# Patient Record
Sex: Male | Born: 1955 | Race: White | Hispanic: No | Marital: Married | Smoking: Former smoker
Health system: Southern US, Community
[De-identification: ages and names within clinical notes are randomized; demographics above are authoritative.]

## PROBLEM LIST (undated history)

## (undated) DIAGNOSIS — I10 Essential (primary) hypertension: Secondary | ICD-10-CM

## (undated) DIAGNOSIS — R011 Cardiac murmur, unspecified: Secondary | ICD-10-CM

## (undated) DIAGNOSIS — J309 Allergic rhinitis, unspecified: Secondary | ICD-10-CM

## (undated) DIAGNOSIS — M1990 Unspecified osteoarthritis, unspecified site: Secondary | ICD-10-CM

## (undated) DIAGNOSIS — M797 Fibromyalgia: Secondary | ICD-10-CM

## (undated) DIAGNOSIS — L409 Psoriasis, unspecified: Secondary | ICD-10-CM

## (undated) DIAGNOSIS — J302 Other seasonal allergic rhinitis: Secondary | ICD-10-CM

## (undated) DIAGNOSIS — E782 Mixed hyperlipidemia: Secondary | ICD-10-CM

## (undated) DIAGNOSIS — D649 Anemia, unspecified: Secondary | ICD-10-CM

## (undated) DIAGNOSIS — R351 Nocturia: Principal | ICD-10-CM

## (undated) DIAGNOSIS — F5101 Primary insomnia: Secondary | ICD-10-CM

## (undated) HISTORY — DX: Fibromyalgia: M79.7

## (undated) HISTORY — DX: Cardiac murmur, unspecified: R01.1

## (undated) HISTORY — PX: BICEPS TENDON REPAIR: SHX566

## (undated) HISTORY — DX: Essential (primary) hypertension: I10

## (undated) HISTORY — DX: Unspecified osteoarthritis, unspecified site: M19.90

## (undated) HISTORY — DX: Psoriasis, unspecified: L40.9

## (undated) HISTORY — DX: Allergic rhinitis, unspecified: J30.9

---

## 2009-03-04 ENCOUNTER — Ambulatory Visit: Payer: Self-pay | Admitting: Diagnostic Radiology

## 2009-03-04 ENCOUNTER — Ambulatory Visit (HOSPITAL_BASED_OUTPATIENT_CLINIC_OR_DEPARTMENT_OTHER): Admission: RE | Admit: 2009-03-04 | Discharge: 2009-03-04 | Payer: Self-pay | Admitting: Family Medicine

## 2012-02-29 ENCOUNTER — Encounter (INDEPENDENT_AMBULATORY_CARE_PROVIDER_SITE_OTHER): Payer: Self-pay | Admitting: Surgery

## 2012-03-02 ENCOUNTER — Other Ambulatory Visit (INDEPENDENT_AMBULATORY_CARE_PROVIDER_SITE_OTHER): Payer: Self-pay | Admitting: Surgery

## 2012-03-02 ENCOUNTER — Telehealth (INDEPENDENT_AMBULATORY_CARE_PROVIDER_SITE_OTHER): Payer: Self-pay | Admitting: General Surgery

## 2012-03-02 ENCOUNTER — Encounter (INDEPENDENT_AMBULATORY_CARE_PROVIDER_SITE_OTHER): Payer: Self-pay | Admitting: Surgery

## 2012-03-02 ENCOUNTER — Ambulatory Visit
Admission: RE | Admit: 2012-03-02 | Discharge: 2012-03-02 | Disposition: A | Discharge status: Home / Self Care | Payer: Self-pay | Source: Ambulatory Visit | Attending: Surgery | Admitting: Surgery

## 2012-03-02 ENCOUNTER — Ambulatory Visit (INDEPENDENT_AMBULATORY_CARE_PROVIDER_SITE_OTHER): Payer: BC Managed Care – PPO | Admitting: Surgery

## 2012-03-02 VITALS — BP 154/98 | HR 46 | Temp 97.8°F | Ht 67.5 in | Wt 192.8 lb

## 2012-03-02 DIAGNOSIS — N433 Hydrocele, unspecified: Secondary | ICD-10-CM

## 2012-03-02 DIAGNOSIS — N5089 Other specified disorders of the male genital organs: Secondary | ICD-10-CM | POA: Insufficient documentation

## 2012-03-02 NOTE — Progress Notes (Signed)
Patient ID: Walter Franklin, male   DOB: 10/15/1956, 56 y.o.   MRN: [id number]  Chief Complaint  Patient presents with  . Pre-op Exam    eval RIH    HPI Walter Franklin is a 56 y.o. male.  Referred  By Dr. Aleatha Borer for evaluation of possible right inguinal hernia HPI This is a 56 year old male who presents with a one-month history of swelling in his right scrotum. The patient denies any significant pain in his groin but does fill a lot of heaviness in his scrotum. He denies any obstructive symptoms. He does not remember any exacerbating event. He was evaluated by Dr. Kathrynn Running who felt that he might have a right inguinal hernia and the patient is now referred for surgical evaluation. The patient has a developmentally disabled child at home and has to do a lot of heavy lifting. Past Medical History  Diagnosis Date  . Hypertension   . Allergic rhinitis, cause unspecified   . Fibromyalgia   . Psoriasis   . Arthritis   . Asthma   . Heart murmur     History reviewed. No pertinent past surgical history.  Family History  Problem Relation Age of Onset  . Hypertension Mother     ? pt's not sure    Social History History  Substance Use Topics  . Smoking status: Former Games developer  . Smokeless tobacco: Not on file  . Alcohol Use: No    Allergies  Allergen Reactions  . Tetanus Toxoids     Current Outpatient Prescriptions  Medication Sig Dispense Refill  . ADALIMUMAB McEwen Inject into the skin.      . fluticasone (FLONASE) 50 MCG/ACT nasal spray Place 2 sprays into the nose daily.      . hydrochlorothiazide (HYDRODIURIL) 25 MG tablet Take 25 mg by mouth daily.      Marland Kitchen ibuprofen (ADVIL,MOTRIN) 200 MG tablet Take 200 mg by mouth every 6 (six) hours as needed.      Marland Kitchen losartan (COZAAR) 100 MG tablet Take 100 mg by mouth daily.        Review of Systems Review of Systems  Constitutional: Negative for fever, chills and unexpected weight change.  HENT: Negative for hearing loss,  congestion, sore throat, trouble swallowing and voice change.   Eyes: Negative for visual disturbance.  Respiratory: Negative for cough and wheezing.   Cardiovascular: Negative for chest pain, palpitations and leg swelling.  Gastrointestinal: Negative for nausea, vomiting, abdominal pain, diarrhea, constipation, blood in stool, abdominal distention, anal bleeding and rectal pain.  Genitourinary: Positive for scrotal swelling and testicular pain. Negative for hematuria and difficulty urinating.  Musculoskeletal: Positive for arthralgias.  Skin: Negative for rash and wound.  Neurological: Negative for seizures, syncope, weakness and headaches.  Hematological: Negative for adenopathy. Does not bruise/bleed easily.  Psychiatric/Behavioral: Negative for confusion.    Blood pressure 154/98, pulse 46, temperature 97.8 F (36.6 C), temperature source Temporal, height 5' 7.5" (1.715 m), weight 192 lb 12.8 oz (87.454 kg), SpO2 98.00%.  Physical Exam Physical Exam WDWN in NAD HEENT:  EOMI, sclera anicteric Neck:  No masses, no thyromegaly Lungs:  CTA bilaterally; normal respiratory effort CV:  Regular rate and rhythm; no murmurs Abd:  +bowel sounds, soft, non-tender, no masses GU;  Large right scrotal swelling with no inflammation; some penile retraction; This scrotal mass extends up to the groin, but does not reduce.  This mass does not really enlarge with Valsalva maneuver. Ext:  Well-perfused; no edema Skin:  Warm,  dry; no sign of jaundice  Data Reviewed none  Assessment    Right scrotal swelling - possible right inguinal hernia vs. hydrocele    Plan    Right scrotal ultrasound to rule out hydrocele.  If this is a hydrocele, then we will refer to urology.  If not, then we will plan a right inguinal hernia repair with mesh.  Recheck in 1 week.       Ravin Bendall K. 03/02/2012, 12:23 PM

## 2012-03-02 NOTE — Telephone Encounter (Signed)
Called pt and got him set to see Dr Brunilda Payor on 03-07-12 and I cancelled his appt on the 03-07-12 with [place]

## 2012-03-07 ENCOUNTER — Encounter (INDEPENDENT_AMBULATORY_CARE_PROVIDER_SITE_OTHER): Payer: BC Managed Care – PPO | Admitting: Surgery

## 2013-02-21 ENCOUNTER — Encounter (INDEPENDENT_AMBULATORY_CARE_PROVIDER_SITE_OTHER): Payer: Self-pay | Admitting: Surgery

## 2013-02-22 ENCOUNTER — Other Ambulatory Visit: Payer: Self-pay | Admitting: Urology

## 2013-04-03 ENCOUNTER — Ambulatory Visit (HOSPITAL_BASED_OUTPATIENT_CLINIC_OR_DEPARTMENT_OTHER): Admission: RE | Admit: 2013-04-03 | Payer: BC Managed Care – PPO | Source: Ambulatory Visit | Admitting: Urology

## 2013-04-03 ENCOUNTER — Encounter (HOSPITAL_BASED_OUTPATIENT_CLINIC_OR_DEPARTMENT_OTHER): Admission: RE | Payer: Self-pay | Source: Ambulatory Visit

## 2013-04-03 SURGERY — HYDROCELECTOMY
Anesthesia: General | Laterality: Right

## 2017-01-22 ENCOUNTER — Encounter (HOSPITAL_BASED_OUTPATIENT_CLINIC_OR_DEPARTMENT_OTHER): Payer: Self-pay | Admitting: Emergency Medicine

## 2017-01-22 ENCOUNTER — Emergency Department (HOSPITAL_BASED_OUTPATIENT_CLINIC_OR_DEPARTMENT_OTHER): Payer: Managed Care, Other (non HMO)

## 2017-01-22 ENCOUNTER — Emergency Department (HOSPITAL_BASED_OUTPATIENT_CLINIC_OR_DEPARTMENT_OTHER)
Admission: EM | Admit: 2017-01-22 | Discharge: 2017-01-22 | Disposition: A | Discharge status: Home / Self Care | Payer: Managed Care, Other (non HMO) | Attending: Emergency Medicine | Admitting: Emergency Medicine

## 2017-01-22 DIAGNOSIS — I10 Essential (primary) hypertension: Secondary | ICD-10-CM | POA: Diagnosis not present

## 2017-01-22 DIAGNOSIS — J45909 Unspecified asthma, uncomplicated: Secondary | ICD-10-CM | POA: Insufficient documentation

## 2017-01-22 DIAGNOSIS — R1011 Right upper quadrant pain: Secondary | ICD-10-CM

## 2017-01-22 DIAGNOSIS — K5900 Constipation, unspecified: Secondary | ICD-10-CM | POA: Insufficient documentation

## 2017-01-22 DIAGNOSIS — Z87891 Personal history of nicotine dependence: Secondary | ICD-10-CM | POA: Insufficient documentation

## 2017-01-22 DIAGNOSIS — Z79899 Other long term (current) drug therapy: Secondary | ICD-10-CM | POA: Insufficient documentation

## 2017-01-22 LAB — COMPREHENSIVE METABOLIC PANEL
ALT: 28 U/L (ref 17–63)
ANION GAP: 7 (ref 5–15)
AST: 21 U/L (ref 15–41)
Albumin: 4.1 g/dL (ref 3.5–5.0)
Alkaline Phosphatase: 56 U/L (ref 38–126)
BUN: 12 mg/dL (ref 6–20)
CALCIUM: 9.3 mg/dL (ref 8.9–10.3)
CHLORIDE: 106 mmol/L (ref 101–111)
CO2: 27 mmol/L (ref 22–32)
CREATININE: 0.89 mg/dL (ref 0.61–1.24)
Glucose, Bld: 94 mg/dL (ref 65–99)
Potassium: 4.1 mmol/L (ref 3.5–5.1)
SODIUM: 140 mmol/L (ref 135–145)
Total Bilirubin: 0.7 mg/dL (ref 0.3–1.2)
Total Protein: 7.6 g/dL (ref 6.5–8.1)

## 2017-01-22 LAB — CBC WITH DIFFERENTIAL/PLATELET
BASOS ABS: 0.1 10*3/uL (ref 0.0–0.1)
Basophils Relative: 1 %
EOS ABS: 0.2 10*3/uL (ref 0.0–0.7)
Eosinophils Relative: 2 %
HCT: 38.7 % — ABNORMAL LOW (ref 39.0–52.0)
HEMOGLOBIN: 12.7 g/dL — AB (ref 13.0–17.0)
LYMPHS PCT: 18 %
Lymphs Abs: 1.7 10*3/uL (ref 0.7–4.0)
MCH: 21.2 pg — ABNORMAL LOW (ref 26.0–34.0)
MCHC: 32.8 g/dL (ref 30.0–36.0)
MCV: 64.7 fL — ABNORMAL LOW (ref 78.0–100.0)
Monocytes Absolute: 0.9 10*3/uL (ref 0.1–1.0)
Monocytes Relative: 10 %
NEUTROS PCT: 69 %
Neutro Abs: 6.3 10*3/uL (ref 1.7–7.7)
PLATELETS: 227 10*3/uL (ref 150–400)
RBC: 5.98 MIL/uL — AB (ref 4.22–5.81)
RDW: 17.6 % — ABNORMAL HIGH (ref 11.5–15.5)
WBC: 9.2 10*3/uL (ref 4.0–10.5)

## 2017-01-22 LAB — D-DIMER, QUANTITATIVE (NOT AT ARMC): D DIMER QUANT: 0.29 ug{FEU}/mL (ref 0.00–0.50)

## 2017-01-22 LAB — LIPASE, BLOOD: LIPASE: 29 U/L (ref 11–51)

## 2017-01-22 MED ORDER — LIDOCAINE HCL 2 % IJ SOLN: 10.0000 mL | Freq: Once | INTRAMUSCULAR | Status: DC

## 2017-01-22 MED ORDER — MAGNESIUM CITRATE PO SOLN
1.0000 | Freq: Once | ORAL | Status: AC
  Administered 2017-01-22: 1 via ORAL
  Filled 2017-01-22: qty 296

## 2017-01-22 NOTE — Discharge Instructions (Signed)
Take magnesium citrate to help you have a bowel movement. Tylenol for pain as needed. If no improvement with having a normal bowel movement, return to ED or follow up with your doctor for further imaging. Return if worsening symptoms.

## 2017-01-22 NOTE — ED Provider Notes (Signed)
[facility] Note   CSN: [id number] Arrival date & time: 01/22/17  1242     History   Chief Complaint Chief Complaint  Patient presents with  . Abdominal Pain    HPI Walter Franklin is a 61 y.o. male.  HPI Walter Franklin is a 61 y.o. male with history of fibromyalgia, arthritis, hypertension, presents to emergency department complaining of abdominal pain. Patient states that he is having pain in the right upper quadrant for several days. Denies associated nausea or vomiting. He also reports feeling constipated. Is unable to have abdominal bowel movement, states "just have small pebbles come out." He has taken MiraLAX yesterday and a dose of MiraLAX today with no improvement. Denies any fever or chills. He does have pain that is worse after eating and also with deep breathing. Patient does travel by car a lot for his job. Travels back and forth from [place]. No medications other than MiraLAX taken prior to coming in.  Past Medical History:  Diagnosis Date  . Allergic rhinitis, cause unspecified   . Arthritis   . Asthma   . Fibromyalgia   . Heart murmur   . Hypertension   . Psoriasis     Patient Active Problem List   Diagnosis Date Noted  . Scrotal swelling 03/02/2012    Past Surgical History:  Procedure Laterality Date  . BICEPS TENDON REPAIR         Home Medications    Prior to Admission medications   Medication Sig Start Date End Date Taking? Authorizing Provider  ADALIMUMAB Secor Inject into the skin.   Yes Historical Provider, MD  albuterol (PROVENTIL HFA;VENTOLIN HFA) 108 (90 Base) MCG/ACT inhaler Inhale 1-2 puffs into the lungs every 6 (six) hours as needed for wheezing or shortness of breath.   Yes Historical Provider, MD  amLODipine (NORVASC) 10 MG tablet Take 10 mg by mouth daily.   Yes Historical Provider, MD  Azilsartan Medoxomil (EDARBI) 80 MG TABS Take by mouth.   Yes Historical Provider, MD  fluticasone (FLONASE) 50  MCG/ACT nasal spray Place 2 sprays into the nose daily.   Yes Historical Provider, MD  ibuprofen (ADVIL,MOTRIN) 200 MG tablet Take 200 mg by mouth every 6 (six) hours as needed.   Yes Historical Provider, MD  losartan (COZAAR) 100 MG tablet Take 100 mg by mouth daily.   Yes Historical Provider, MD  rosuvastatin (CRESTOR) 5 MG tablet Take 5 mg by mouth daily.   Yes Historical Provider, MD  hydrochlorothiazide (HYDRODIURIL) 25 MG tablet Take 25 mg by mouth daily.    Historical Provider, MD    Family History Family History  Problem Relation Age of Onset  . Hypertension Mother     ? pt's not sure    Social History Social History  Substance Use Topics  . Smoking status: Former Games developermoker  . Smokeless tobacco: Never Used  . Alcohol use No     Allergies   Tetanus toxoids   Review of Systems Review of Systems  Constitutional: Negative for chills and fever.  Respiratory: Negative for cough, chest tightness and shortness of breath.   Cardiovascular: Negative for chest pain, palpitations and leg swelling.  Gastrointestinal: Positive for abdominal pain and constipation. Negative for abdominal distention, diarrhea, nausea and vomiting.  Genitourinary: Negative for dysuria, frequency, hematuria and urgency.  Musculoskeletal: Negative for arthralgias, myalgias, neck pain and neck stiffness.  Skin: Negative for rash.  Allergic/Immunologic: Negative for immunocompromised state.  Neurological: Negative for dizziness, weakness, light-headedness,  numbness and headaches.     Physical Exam Updated Vital Signs BP (!) 159/100 (BP Location: Left Arm)   Pulse 65   Temp 98.9 F (37.2 C) (Oral)   Ht 5\' 7"  (1.702 m)   Wt 83.9 kg   SpO2 100%   BMI 28.98 kg/m   Physical Exam  Constitutional: He appears well-developed and well-nourished. No distress.  HENT:  Head: Normocephalic and atraumatic.  Eyes: Conjunctivae are normal.  Neck: Neck supple.  Cardiovascular: Normal rate, regular rhythm and  normal heart sounds.   Pulmonary/Chest: Effort normal. No respiratory distress. He has no wheezes. He has no rales.  Abdominal: Soft. Bowel sounds are normal. He exhibits no distension. There is tenderness. There is no rebound.  Right upper quadrant tenderness  Musculoskeletal: He exhibits no edema.  Neurological: He is alert.  Skin: Skin is warm and dry.  Nursing note and vitals reviewed.    ED Treatments / Results  Labs (all labs ordered are listed, but only abnormal results are displayed) Labs Reviewed  CBC WITH DIFFERENTIAL/PLATELET - Abnormal; Notable for the following:       Result Value   RBC 5.98 (*)    Hemoglobin 12.7 (*)    HCT 38.7 (*)    MCV 64.7 (*)    MCH 21.2 (*)    RDW 17.6 (*)    All other components within normal limits  COMPREHENSIVE METABOLIC PANEL  LIPASE, BLOOD  D-DIMER, QUANTITATIVE (NOT AT [facility])    EKG  EKG Interpretation None       Radiology US Abdomen Complete  Result Date: 01/22/2017 CLINICAL DATA:  Patient with stabbing right upper quadrant abdominal pain. EXAM: ABDOMEN ULTRASOUND COMPLETE COMPARISON:  Testicular ultrasound 03/02/2012 FINDINGS: Gallbladder: No gallstones or wall thickening visualized. No sonographic Murphy sign noted by sonographer. Common bile duct: Diameter: 2.8 mm Liver: Normal echogenicity. Subcentimeter hypoechoic lesion may represent a small cyst however is too small to characterize. IVC: No abnormality visualized. Pancreas: Not visualized due to overlying bowel gas. Spleen: Size and appearance within normal limits. Right Kidney: Length: 10.9 cm. Echogenicity within normal limits. No mass or hydronephrosis visualized. Left Kidney: Length: 12.1 cm. Echogenicity within normal limits. No mass or hydronephrosis visualized. Abdominal aorta: No aneurysm visualized. Other findings: None. IMPRESSION: No acute process within the abdomen. Electronically Signed   By: Annia Belt M.D.   On: 01/22/2017 15:44    Procedures Procedures  (including critical care time)  Medications Ordered in ED Medications  magnesium citrate solution 1 Bottle (not administered)     Initial Impression / Assessment and Plan / ED Course  I have reviewed the triage vital signs and the nursing notes.  Pertinent labs & imaging results that were available during my care of the patient were reviewed by me and considered in my medical decision making (see chart for details).     Pt in ED with RUQ abdominal pain and right pleuritic pain. Risk factor for PE given prolonged travel. Will check labs including LFTs and D dimer.   Labs negative including d dimer. WIll get [place] to ro cholelithiasis/cholecystitis.   4:15 PM [place] negative. Patient has not had any nausea or vomiting, no fevers, normal WBC, considered colitis, however given no elevation with blood cell count and no diarrhea most unlikely. Patient is having some constipation and has not had a normal bowel movement in several days. I will treat him with magnesium citrate. Advised to continue MiraLAX. If pain is not improved with normal bowel movement, advised  to follow-up with family doctor or return if worsening symptoms. Patient voiced understanding.  Vitals:   01/22/17 1248 01/22/17 1612  BP: (!) 159/100 (!) 147/96  Pulse: 65 82  Resp:  20  Temp: 98.9 F (37.2 C)   TempSrc: Oral   SpO2: 100% 100%  Weight: 83.9 kg   Height: 5\' 7"  (1.702 m)      Final Clinical Impressions(s) / ED Diagnoses   Final diagnoses:  Right upper quadrant abdominal pain    New Prescriptions New Prescriptions   No medications on file     Jaynie Crumble, PA-C 01/22/17 1616    Melene Plan, DO 01/23/17 [phone]

## 2017-01-22 NOTE — ED Notes (Signed)
Pt discharged to home with family. NAD.  

## 2017-01-22 NOTE — ED Triage Notes (Signed)
Pt states stabbing RUQ pain for past few days and constipation for past 1 week.  Pt states he has had very small hard bowel movements in past few days and normally he is very regular.

## 2019-06-08 LAB — COVID-19: CORONAVIRUS (COVID-19), NAA: NOT DETECTED

## 2019-06-12 LAB — COMPREHENSIVE METABOLIC PANEL
ALT: 31 U/L (ref 0–41)
AST: 22 U/L (ref 0–40)
Albumin/Globulin Ratio: 1.5 mmol/L (ref 1.00–2.00)
Albumin: 4.3 g/dL (ref 3.5–5.2)
Alk Phosphatase: 58 U/L (ref 40–130)
Anion Gap: 12 mmol/L (ref 2–17)
BUN: 35 mg/dL — ABNORMAL HIGH (ref 8–23)
CO2: 26 mmol/L (ref 22–29)
Calcium: 9.5 mg/dL (ref 8.8–10.2)
Chloride: 105 mmol/L (ref 98–107)
Creatinine: 1.3 mg/dL (ref 0.7–1.3)
GFR African American: 67 mL/min/{1.73_m2} — ABNORMAL LOW (ref >=90)
GFR Non-African American: 58 mL/min/{1.73_m2} — ABNORMAL LOW (ref >=90)
Globulin: 3 g/dL (ref 1.9–4.4)
Glucose: 105 mg/dL — ABNORMAL HIGH (ref 70–99)
Osmolaliy Calculated: 293 mosm/kg — ABNORMAL HIGH (ref 270–287)
Potassium: 4.1 mmol/L (ref 3.5–5.3)
Sodium: 143 mmol/L (ref 135–145)
Total Bilirubin: 0.5 mg/dL (ref 0.00–1.20)
Total Protein: 7.1 g/dL (ref 6.4–8.3)

## 2019-06-12 LAB — MAGNESIUM: Magnesium: 2.6 mg/dL (ref 1.6–2.6)

## 2019-06-12 LAB — LIPID PANEL
Chol/HDL Ratio: 3.6 (ref 0.0–4.4)
Cholesterol: 143 mg/dL (ref 100–200)
HDL: 40 mg/dL — ABNORMAL LOW (ref 55–72)
LDL Cholesterol: 74 mg/dL (ref 0.0–100.0)
LDL/HDL Ratio: 1.9
Triglycerides: 145 mg/dL (ref 0–149)
VLDL: 29 mg/dL (ref 5.0–40.0)

## 2019-06-12 LAB — PSA SCREENING: PSA, Screening: 0.575 ng/mL (ref 0.000–4.000)

## 2019-06-13 LAB — CBC WITH AUTO DIFFERENTIAL
Basophils %: 1.4 % (ref 0.0–2.0)
Basophils Absolute: 0.1 10*3/uL (ref 0.0–0.2)
Eosinophils %: 6.4 % (ref 0.0–7.0)
Eosinophils Absolute: 0.4 10*3/uL (ref 0.0–0.5)
Hematocrit: 39 % (ref 38.0–52.0)
Hemoglobin: 12.1 g/dL — ABNORMAL LOW (ref 13.0–17.3)
Immature Grans (Abs): 0.01 10*3/uL (ref 0.00–0.06)
Immature Granulocytes %: 0.2 % (ref 0.1–0.6)
Lymphocytes Absolute: 2.3 10*3/uL (ref 1.0–3.2)
Lymphocytes: 38.3 % (ref 15.0–45.0)
MCH: 21.4 pg — ABNORMAL LOW (ref 27.0–34.5)
MCHC: 31 g/dL — ABNORMAL LOW (ref 32.0–36.0)
MCV: 68.9 fL — ABNORMAL LOW (ref 84.0–100.0)
MPV: 11.5 fL (ref 7.2–13.2)
Monocytes %: 12.8 % — ABNORMAL HIGH (ref 4.0–12.0)
Monocytes Absolute: 0.8 10*3/uL (ref 0.3–1.0)
NRBC Absolute: 0 10*3/uL (ref 0.000–0.012)
NRBC Automated: 0 % (ref 0.0–0.2)
Neutrophils %: 40.9 % — ABNORMAL LOW (ref 42.0–74.0)
Neutrophils Absolute: 2.4 10*3/uL (ref 1.6–7.3)
Platelets: 236 10*3/uL (ref 140–440)
RBC: 5.66 x10e6/mcL — ABNORMAL HIGH (ref 4.00–5.60)
RDW: 16.5 % — ABNORMAL HIGH (ref 11.0–16.0)
WBC: 5.9 10*3/uL (ref 3.8–10.6)

## 2019-06-13 LAB — MORPHOLOGY CHECK
Platelet Estimate: ADEQUATE
RBC Morphology: ABNORMAL — AB

## 2019-06-19 LAB — IRON AND TIBC
Iron % Saturation: 40 % (ref 20–40)
Iron: 119 ug/dL (ref 59–158)
TIBC: 298 ug/dL (ref 250–450)
UIBC: 179 ug/dL (ref 112.0–347.0)

## 2019-06-19 LAB — CBC WITH AUTO DIFFERENTIAL
Basophils %: 1.3 % (ref 0.0–2.0)
Basophils Absolute: 0.1 10*3/uL (ref 0.0–0.2)
Eosinophils %: 5.1 % (ref 0.0–7.0)
Eosinophils Absolute: 0.3 10*3/uL (ref 0.0–0.5)
Hematocrit: 39.8 % (ref 38.0–52.0)
Hemoglobin: 12.6 g/dL — ABNORMAL LOW (ref 13.0–17.3)
Immature Grans (Abs): 0.01 10*3/uL (ref 0.00–0.06)
Immature Granulocytes %: 0.2 % (ref 0.1–0.6)
Lymphocytes Absolute: 2.1 10*3/uL (ref 1.0–3.2)
Lymphocytes: 34.8 % (ref 15.0–45.0)
MCH: 21.7 pg — ABNORMAL LOW (ref 27.0–34.5)
MCHC: 31.7 g/dL — ABNORMAL LOW (ref 32.0–36.0)
MCV: 68.6 fL — ABNORMAL LOW (ref 84.0–100.0)
MPV: 11.2 fL (ref 7.2–13.2)
Monocytes %: 9.6 % (ref 4.0–12.0)
Monocytes Absolute: 0.6 10*3/uL (ref 0.3–1.0)
NRBC Absolute: 0 10*3/uL (ref 0.000–0.012)
NRBC Automated: 0 % (ref 0.0–0.2)
Neutrophils %: 49 % (ref 42.0–74.0)
Neutrophils Absolute: 3 10*3/uL (ref 1.6–7.3)
Platelets: 247 10*3/uL (ref 140–440)
RBC: 5.8 x10e6/mcL — ABNORMAL HIGH (ref 4.00–5.60)
RDW: 16 % (ref 11.0–16.0)
WBC: 6 10*3/uL (ref 3.8–10.6)

## 2019-06-19 LAB — MORPHOLOGY CHECK
Platelet Estimate: ADEQUATE
RBC Morphology: ABNORMAL — AB

## 2019-06-19 LAB — VITAMIN B12: Vitamin B-12: 701 pg/mL (ref 232–1245)

## 2019-06-19 LAB — FOLATE: Folate: >20 ng/mL (ref 4.80–24.20)

## 2019-06-19 LAB — FERRITIN: Ferritin: 173.7 ng/mL (ref 30.0–400.0)

## 2021-06-04 ENCOUNTER — Encounter

## 2021-06-22 ENCOUNTER — Encounter: Admit: 2021-06-22 | Discharge: 2021-06-22 | Payer: BLUE CROSS/BLUE SHIELD | Primary: Family Medicine

## 2021-06-22 DIAGNOSIS — I10 Essential (primary) hypertension: Secondary | ICD-10-CM

## 2021-06-22 LAB — AMB POC COMPLETE CBC, AUTOMATED
Hematocrit, POC: 36.4 %
Hemoglobin, POC: 11.6 G/DL
Platelet Count, POC: 212 10*3/uL
WBC, POC: 6.49 10*3/uL

## 2021-06-22 LAB — COMPREHENSIVE METABOLIC PANEL
ALT: 38 U/L (ref 0–50)
AST: 26 U/L (ref 0–50)
Albumin/Globulin Ratio: 1.7 (ref 1.00–2.70)
Albumin: 4.5 g/dL (ref 3.5–5.2)
Alk Phosphatase: 51 U/L (ref 40–130)
Anion Gap: 11 mmol/L (ref 2–17)
BUN: 27 mg/dL — ABNORMAL HIGH (ref 8–23)
CO2: 27 mmol/L (ref 22–29)
Calcium: 9.6 mg/dL (ref 8.8–10.2)
Chloride: 104 mmol/L (ref 98–107)
Creatinine: 1.3 mg/dL (ref 0.7–1.3)
Est, Glom Filt Rate: 61 mL/min/1.73m?? — ABNORMAL LOW (ref >=90)
Globulin: 2.6 g/dL (ref 1.9–4.4)
Glucose: 111 mg/dL — ABNORMAL HIGH (ref 70–99)
OSMOLALITY CALCULATED: 289 mOsm/kg — ABNORMAL HIGH (ref 270–287)
Potassium: 4 mmol/L (ref 3.5–5.3)
Sodium: 142 mmol/L (ref 135–145)
Total Bilirubin: 0.36 mg/dL (ref 0.00–1.20)
Total Protein: 7.1 g/dL (ref 6.4–8.3)

## 2021-06-22 LAB — PSA SCREENING: Screening PSA: 0.482 ng/mL (ref 0.000–4.000)

## 2021-06-22 LAB — LIPID PANEL
Chol/HDL Ratio: 3.1 (ref 0.0–4.4)
Cholesterol: 131 mg/dL (ref 100–200)
HDL: 42 mg/dL (ref >=40)
LDL Cholesterol: 56.6 mg/dL (ref 0.0–100.0)
LDL/HDL Ratio: 1.3
Triglycerides: 162 mg/dL — ABNORMAL HIGH (ref 0–149)
VLDL: 32.4 mg/dL (ref 5.0–40.0)

## 2021-06-22 LAB — MAGNESIUM: Magnesium: 2.1 mg/dL (ref 1.6–2.6)

## 2021-06-22 LAB — IRON AND TIBC
Iron Saturation: 31 % (ref 20–40)
Iron: 97 ug/dL (ref 59–158)
TIBC: 316 ug/dL (ref 250–450)
UIBC: 219 ug/dL (ref 112.0–347.0)

## 2021-06-22 LAB — FERRITIN: Ferritin: 107 ng/mL (ref 30.0–400.0)

## 2021-06-22 LAB — FOLATE: Folate: >20 ng/mL (ref 4.80–24.20)

## 2021-06-22 LAB — VITAMIN B12: Vitamin B-12: 536 pg/mL (ref 232–1245)

## 2021-06-22 NOTE — Patient Instructions (Signed)
05/22/2021

## 2021-07-01 NOTE — Telephone Encounter (Signed)
Pt would like a call back about lab results

## 2021-07-10 NOTE — Telephone Encounter (Signed)
Pt calling back asking for lab results. Please advise.Marland KitchenMarland Kitchen

## 2021-07-10 NOTE — Telephone Encounter (Signed)
Called pt for 2nd time - lm

## 2021-07-14 MED ORDER — FLUTICASONE PROPIONATE 50 MCG/ACT NA SUSP
50 MCG/ACT | Freq: Every day | NASAL | 3 refills | Status: DC
Start: 2021-07-14 — End: 2023-04-20

## 2021-07-14 NOTE — Telephone Encounter (Signed)
From: Larna Daughters  To: Office of Dr. Tera Partridge  Sent: 07/13/2021 4:11 PM EDT  Subject: Medication Renewal Request    Refills have been requested for the following medications:   Other - Not sure why it's not on the list, but please refill my Fluticasone Nasal Spray. Thank you.    Preferred pharmacy: [facility] [address] [place] - [address] - P [phone] - F [phone]

## 2021-07-14 NOTE — Telephone Encounter (Signed)
Refill sent

## 2021-08-08 ENCOUNTER — Encounter

## 2021-08-15 ENCOUNTER — Encounter

## 2021-08-17 NOTE — Telephone Encounter (Signed)
From: Larna Daughters  To: Office of Dr. Tera Partridge  Sent: 08/15/2021 10:31 AM EDT  Subject: Medication Renewal Request    Refills have been requested for the following medications:   Other - [facility] my chlorthalidone, amlodipine, and rosuvastatin as I'm running very low.    Preferred pharmacy: [facility] [address] [place] - [address] - P [phone] - F [phone]

## 2021-08-21 MED ORDER — ROSUVASTATIN CALCIUM 10 MG PO TABS
10 MG | ORAL_TABLET | ORAL | 2 refills | Status: AC
Start: 2021-08-21 — End: 2022-05-29

## 2021-08-21 MED ORDER — AMLODIPINE BESYLATE 5 MG PO TABS
5 MG | ORAL_TABLET | ORAL | 2 refills | Status: AC
Start: 2021-08-21 — End: 2022-05-29

## 2021-08-21 MED ORDER — CHLORTHALIDONE 25 MG PO TABS
25 MG | ORAL_TABLET | ORAL | 2 refills | Status: AC
Start: 2021-08-21 — End: 2022-05-29

## 2021-09-16 MED ORDER — IBUPROFEN 800 MG PO TABS
800 MG | ORAL_TABLET | Freq: Two times a day (BID) | ORAL | 1 refills | Status: AC | PRN
Start: 2021-09-16 — End: 2021-12-15

## 2021-10-12 NOTE — Telephone Encounter (Addendum)
Called patient. Left message for patient to call back to schedule OV.

## 2021-10-13 MED ORDER — EDARBI 80 MG PO TABS
80 MG | ORAL_TABLET | ORAL | 3 refills | Status: AC
Start: 2021-10-13 — End: 2022-05-31

## 2021-10-22 ENCOUNTER — Ambulatory Visit: Admit: 2021-10-22 | Discharge: 2021-10-22 | Payer: BLUE CROSS/BLUE SHIELD | Attending: Family | Primary: Family Medicine

## 2021-10-22 DIAGNOSIS — H9201 Otalgia, right ear: Secondary | ICD-10-CM

## 2021-10-22 MED ORDER — FLUTICASONE PROPIONATE 50 MCG/ACT NA SUSP
50 MCG/ACT | Freq: Two times a day (BID) | NASAL | 5 refills | Status: AC
Start: 2021-10-22 — End: ?

## 2021-10-22 MED ORDER — LORATADINE 10 MG PO TABS
10 MG | ORAL_TABLET | Freq: Every day | ORAL | 1 refills | Status: AC
Start: 2021-10-22 — End: 2021-11-21

## 2021-10-22 MED ORDER — AZITHROMYCIN 250 MG PO TABS
250 MG | ORAL_TABLET | ORAL | 0 refills | Status: AC
Start: 2021-10-22 — End: 2021-10-27

## 2021-10-22 NOTE — Progress Notes (Signed)
Genie A Nordmann (DOB:  July 28, 1956) is a 65 y.o. male,Established patient, here for evaluation of the following chief complaint(s):  Ear Fullness (Right ear feels like there is water in it x 2 weeks) and Cough (Feels like he is "getting a chest cold" for the past 2 weeks )         ASSESSMENT/PLAN:  1. Acute otalgia, right    No follow-ups on file.         Subjective   SUBJECTIVE/OBJECTIVE:  65 year old male presents to [facility] complaining of right ear pain, pressure and feeling of fluid behind his eardrum.  He had pain for couple weeks but yesterday he flew home from [place], noticed worsening pain on the flight.  Denies any fever, chills, body aches.  Denies any cough, shortness of breath    Ear Fullness   Pertinent negatives include no coughing, hearing loss or rhinorrhea.   Cough  Associated symptoms include ear pain. Pertinent negatives include no postnasal drip or rhinorrhea.     Review of Systems   HENT:  Positive for ear pain. Negative for hearing loss, postnasal drip, rhinorrhea, sinus pressure and sinus pain.    Respiratory:  Negative for cough.    All other systems reviewed and are negative.       Objective   Physical Exam  Constitutional:       Appearance: Normal appearance.   HENT:      Right Ear: There is no impacted cerumen.      Ears:      Comments: Right GU:[id number]., fluid noted     Nose: Nose normal.      Mouth/Throat:      Mouth: Mucous membranes are moist.      Pharynx: Oropharynx is clear.   Cardiovascular:      Rate and Rhythm: Normal rate and regular rhythm.      Heart sounds: Normal heart sounds.   Pulmonary:      Breath sounds: Normal breath sounds.   Skin:     General: Skin is warm and dry.   Neurological:      Mental Status: He is alert.   Psychiatric:         Mood and Affect: Mood normal.         Behavior: Behavior normal.                An electronic signature was used to authenticate this note.    --Coralee Pesa, APRN - NP

## 2021-10-22 NOTE — Patient Instructions (Signed)
Will treat with Azithromycin.  Discussed medication side effects.  Discussed supportive care including OTC nasal decongestants and tylenol and ibuprofen as needed for pain.  Advised patient to follow-up with PCP if concern for new/worsening symptoms or if no better in 1 week.  Also discussed possible need for ENT evaluation if patient does not improve with this treatment plan. patient in agreement with plan.

## 2021-12-23 ENCOUNTER — Encounter

## 2021-12-28 MED ORDER — AZELASTINE HCL 0.1 % NA SOLN
0.1 % | Freq: Two times a day (BID) | NASAL | 3 refills | Status: DC
Start: 2021-12-28 — End: 2023-04-20

## 2022-02-03 MED ORDER — ALBUTEROL SULFATE HFA 108 (90 BASE) MCG/ACT IN AERS
108 (90 Base) MCG/ACT | RESPIRATORY_TRACT | 2 refills | Status: AC | PRN
Start: 2022-02-03 — End: 2023-02-25

## 2022-05-24 ENCOUNTER — Encounter: Payer: BLUE CROSS/BLUE SHIELD | Attending: Family Medicine | Primary: Family Medicine

## 2022-05-31 MED ORDER — IBUPROFEN 800 MG PO TABS
800 MG | ORAL_TABLET | Freq: Two times a day (BID) | ORAL | 1 refills | Status: DC | PRN
Start: 2022-05-31 — End: 2023-04-20

## 2022-05-31 MED ORDER — AMLODIPINE BESYLATE 5 MG PO TABS
5 MG | ORAL_TABLET | ORAL | 2 refills | Status: AC
Start: 2022-05-31 — End: 2023-02-25

## 2022-05-31 MED ORDER — CHLORTHALIDONE 25 MG PO TABS
25 MG | ORAL_TABLET | ORAL | 2 refills | Status: AC
Start: 2022-05-31 — End: 2023-02-25

## 2022-05-31 MED ORDER — ROSUVASTATIN CALCIUM 10 MG PO TABS
10 MG | ORAL_TABLET | ORAL | 2 refills | Status: AC
Start: 2022-05-31 — End: 2023-02-25

## 2022-06-01 MED ORDER — EDARBI 80 MG PO TABS
80 MG | ORAL_TABLET | ORAL | 3 refills | Status: AC
Start: 2022-06-01 — End: 2023-02-25

## 2022-06-20 ENCOUNTER — Encounter

## 2022-06-22 NOTE — Patient Instructions (Addendum)
has a hx of hypertension, hyperlipidemia, seasonal allergies, and psoriasis. Pt presents for check up.     Pt takes sildenafil 20mg  prn; he states this does not work for him.      Pt takes crestor 10mg  daily; Lipids last checked 06/22/21. Chol was 131, HDL 42, Triglycerides 162, LDL 56.6.    Pt takes edarbi 80mg , amlodipine 5mg , and chlor 25mg  daily for hypertension. Home blood pressure checks occasionally.

## 2022-06-23 ENCOUNTER — Ambulatory Visit
Admit: 2022-06-23 | Discharge: 2022-06-23 | Payer: PRIVATE HEALTH INSURANCE | Attending: Family Medicine | Primary: Family Medicine

## 2022-06-23 DIAGNOSIS — I10 Essential (primary) hypertension: Secondary | ICD-10-CM

## 2022-06-23 LAB — AMB POC URINALYSIS DIP STICK AUTO W/O MICRO
Bilirubin, Urine, POC: NEGATIVE
Blood (UA POC): NEGATIVE
Glucose, Urine, POC: NEGATIVE
Ketones, Urine, POC: NEGATIVE
Leukocyte Esterase, Urine, POC: NEGATIVE
Nitrite, Urine, POC: NEGATIVE
Specific Gravity, Urine, POC: 1.03 (ref 1.001–1.035)
Urobilinogen, POC: NORMAL
pH, Urine, POC: 5.5 (ref 4.6–8.0)

## 2022-06-23 LAB — AMB POC COMPLETE CBC, AUTOMATED
Hematocrit, POC: 35.5 %
Hemoglobin, POC: 11.4 G/DL
Platelet Count, POC: 229 10*3/uL
RBC, POC: 5.31 M/uL
WBC, POC: 5.69 10*3/uL

## 2022-06-23 NOTE — Progress Notes (Signed)
CHIEF COMPLAINT:  Chief Complaint   Patient presents with    Annual Exam        HISTORY OF PRESENT ILLNESS:  Timothy Velazquez is a 66 y.o. male  has a hx of hypertension, hyperlipidemia, seasonal allergies, and psoriasis. Pt presents for annual exam       Pt also state that his daughter is having a baby in about 6 weeks. I'd like to get a TDAP shot, but I'm allergic to Tetanus, pt had Td in 11/16/2011     Below is the assessment and plan developed based on review of pertinent history, physical exam, labs, studies, and medications.    IMPRESSION/PLAN:         1. Essential (primary) hypertension  -     AMB POC URINALYSIS DIP STICK AUTO W/O MICRO  -     EKG 12 Lead  -     Comprehensive Metabolic Panel  -     Magnesium  2. Mixed hyperlipidemia  -     Lipid Panel  3. Other seasonal allergic rhinitis  4. Fibromyalgia  5. Annual physical exam  -     AMB POC URINALYSIS DIP STICK AUTO W/O MICRO  -     EKG 12 Lead  6. Other fatigue  -     Testosterone, Free  -     Testosterone  7. Screening for deficiency anemia  -     Vitamin B12  -     Iron And TIBC  -     Ferritin  -     Folate  8. Screening PSA (prostate specific antigen)  -     PSA Screening  9. Longstanding persistent atrial fibrillation (HCC)  -     Comprehensive Metabolic Panel  -     Lipid Panel  -     Magnesium  10. Screening for diabetes mellitus  -     Comprehensive Metabolic Panel  11. Pneumococcal vaccination declined  12. COVID-19 vaccination declined  13. Screening for colon cancer  -     Hillary Bow, MD - Gastroenterology      Vaccine Counseling/ Health Maintenance:  reviewed age appropriate health screenings and immunizations. Discussed importance, risks, and benefits of immunizations. Recommend/ordered deficient screenings and immunizations.     Immunization History   Administered Date(s) Administered    COVID-19, PFIZER PURPLE top, DILUTE for use, (age 24 y+), 59mcg/0.3mL 01/24/2020, 02/14/2020    TD 5LF, TENIVAC, (age 7y+), IM, 0.56mL 11/16/2011         PHQ:  PHQ-9  06/23/2022   Little interest or pleasure in doing things 0   Feeling down, depressed, or hopeless 0   Trouble falling or staying asleep, or sleeping too much 0   Feeling tired or having little energy 0   Poor appetite or overeating 0   Feeling bad about yourself - or that you are a failure or have let yourself or your family down 0   Trouble concentrating on things, such as reading the newspaper or watching television 0   Moving or speaking so slowly that other people could have noticed. Or the opposite - being so fidgety or restless that you have been moving around a lot more than usual 0   Thoughts that you would be better off dead, or of hurting yourself in some way 0   PHQ-2 Score 0   PHQ-9 Total Score 0       CURRENT MEDICATION LIST:  Controlled substances monitoring: not applicable  Prior to Admission medications    Medication Sig Start Date End Date Taking? Authorizing Provider   Azilsartan Medoxomil (EDARBI) 80 MG TABS 1 tablet Orally Once a day for 90 day(s) 06/01/22  Yes Tera Partridge, MD   chlorthalidone (HYGROTON) 25 MG tablet 1 tablet in the morning with food Orally Once a day for 90 days 05/31/22  Yes Tera Partridge, MD   rosuvastatin (CRESTOR) 10 MG tablet 1 tablet Orally Once a day for 90 days 05/31/22  Yes Tera Partridge, MD   amLODIPine (NORVASC) 5 MG tablet 1 tablet Orally Once a day for 90 day(s) 05/31/22  Yes Tera Partridge, MD   ibuprofen (ADVIL;MOTRIN) 800 MG tablet Take 1 tablet by mouth 2 times daily as needed for Pain (take medication with food or milk) 05/31/22 08/29/22 Yes Tera Partridge, MD   albuterol sulfate HFA (VENTOLIN HFA) 108 (90 Base) MCG/ACT inhaler Inhale 2 puffs into the lungs every 4 hours as needed for Wheezing 02/03/22  Yes Tera Partridge, MD   azelastine (ASTELIN) 0.1 % nasal spray 2 sprays by Nasal route 2 times daily 12/28/21 06/23/22 Yes Tera Partridge, MD   fluticasone [facility]) 50 MCG/ACT nasal spray 2 sprays by Each Nostril route daily 07/14/21  Yes Tera Partridge, MD   adalimumab (HUMIRA) 40 MG/0.8ML injection 0.8 ml Subcutaneous q2weeks   Yes [facility] R[facility] Automatic Reconciliation, MD   fluticasone (FLONASE) 50 MCG/ACT nasal spray 2 sprays by Each Nostril route in the morning and 2 sprays in the evening. 10/22/21   Coralee Pesa, APRN - NP   sildenafil (REVATIO) 20 MG tablet 1-5 tablets Orally 1 hour prior to sexual activity  Patient not taking: Reported on 06/23/2022 06/23/20   [facility] Automatic Reconciliation, MD        ALLERGIES:  Allergies   Allergen Reactions    Tetanus Toxoid Other (See Comments)        HISTORY:  Past Medical History:   Diagnosis Date    Allergic rhinitis     Asthma     Atrial fibrillation (HCC)     Fibromyalgia     Headache     Hyperlipidemia     Hypertension     Psoriasis       Past Surgical History:   Procedure Laterality Date    PILONIDAL CYST EXCISION        Social History     Socioeconomic History    Marital status: Married     Spouse name: Not on file    Number of children: Not on file    Years of education: Not on file    Highest education level: Not on file   Occupational History    Not on file   Tobacco Use    Smoking status: Never     Passive exposure: Never    Smokeless tobacco: Never   Vaping Use    Vaping Use: Never used   Substance and Sexual Activity    Alcohol use: Yes     Alcohol/week: 2.0 standard drinks     Types: 2 Drinks containing 0.5 oz of alcohol per week     Comment: Usually have a cocktail with dinner on a Friday or Saturday    Drug use: Never    Sexual activity: Not Currently     Partners: Female   Other Topics Concern    Not on file   Social History Narrative    Not on file  Social Determinants of Health     Financial Resource Strain: Not on file   Food Insecurity: Not on file   Transportation Needs: Not on file   Physical Activity: Not on file   Stress: Not on file   Social Connections: Not on file   Intimate Partner Violence: Not on file   Housing Stability: Not on file      Family History   Problem Relation Age  of Onset    High Blood Pressure Mother     Asthma Mother     Alzheimer's Disease Father     Dementia Father     Alcohol Abuse Brother         REVIEW OF SYSTEMS:  Pertinent items are noted in HPI.  PHYSICAL EXAM:  Vital Signs -   Vitals:    06/23/22 0828   BP: 136/89   Pulse: 51   Temp: 98.4 F (36.9 C)   SpO2: 96%   Weight: 183 lb (83 kg)   Height: 5\' 8"  (1.727 m)     Body mass index is 27.83 kg/m.   General Appearance: Well developed, well nourished in no acute distress   Eyes:  No gross abnormalities.  Ears:  Canals and TMs NI  Nose/Sinuses:  Negative  Mouth/Throat:  Mucosa moist.  No lesions.  Pharynx without erythema, edema or exudate.  Neck: Supple, no mass, non-tender  Heart:  Heart sounds are normal.  Regular rate and rhythm without murmur, gallop or rub.  Lungs: Clear to auscultation bilaterally without rales, rhonchi or wheeze   Neurologic: Alert and oriented. No focal deficit  Psychiatric: Mood, affect, and thought content normal      LABS  Results for orders placed or performed in visit on 06/23/22   AMB POC URINALYSIS DIP STICK AUTO W/O MICRO   Result Value Ref Range    Color (UA POC)      Clarity (UA POC)      Glucose, Urine, POC Negative Negative    Bilirubin, Urine, POC Negative Negative    Ketones, Urine, POC Negative Negative    Specific Gravity, Urine, POC 1.030 1.001 - 1.035    Blood (UA POC) Negative Negative    pH, Urine, POC 5.5 4.6 - 8.0    Protein, Urine, POC Trace Negative    Urobilinogen, POC Normal     Nitrite, Urine, POC Negative Negative    Leukocyte Esterase, Urine, POC Negative Negative         Follow up and Dispositions:  Orders Placed This Encounter    06/25/22, MD - Gastroenterology     Referral Priority:   Routine     Referral Type:   Eval and Treat     Referral Reason:   Specialty Services Required     Referred to Provider:   Hillary Bow, MD     Requested Specialty:   Gastroenterology     Number of Visits Requested:   1    AMB POC URINALYSIS DIP STICK AUTO W/O  MICRO    EKG 12 Lead     Order Specific Question:   Reason for Exam?     Answer:   Other     Comments:   annual exam      No follow-ups on file.       Otis Peak, MA    Please note that portions of this note were completed with computer software and voice recognition program. Although all efforts were made to edit the dictations  for accuracy, there may be errors in transcription that are not intended.

## 2022-06-24 LAB — COMPREHENSIVE METABOLIC PANEL
ALT: 53 U/L — ABNORMAL HIGH (ref 0–50)
AST: 32 U/L (ref 0–50)
Albumin/Globulin Ratio: 1.4 (ref 1.00–2.70)
Albumin: 4.1 g/dL (ref 3.5–5.2)
Alk Phosphatase: 55 U/L (ref 40–130)
Anion Gap: 16 mmol/L (ref 2–17)
BUN: 26 mg/dL — ABNORMAL HIGH (ref 8–23)
CO2: 23 mmol/L (ref 22–29)
Calcium: 9.7 mg/dL (ref 8.8–10.2)
Chloride: 102 mmol/L (ref 98–107)
Creatinine: 1.3 mg/dL (ref 0.7–1.3)
Est, Glom Filt Rate: 61 mL/min/1.73m (ref >=60)
Globulin: 3 g/dL (ref 1.9–4.4)
Glucose: 110 mg/dL — ABNORMAL HIGH (ref 70–99)
OSMOLALITY CALCULATED: 286 mOsm/kg (ref 270–287)
Potassium: 4.3 mmol/L (ref 3.5–5.3)
Sodium: 141 mmol/L (ref 135–145)
Total Bilirubin: 0.33 mg/dL (ref 0.00–1.20)
Total Protein: 7.1 g/dL (ref 6.4–8.3)

## 2022-06-24 LAB — LIPID PANEL
Chol/HDL Ratio: 3 (ref 0.0–4.4)
Cholesterol: 118 mg/dL (ref 100–200)
HDL: 39 mg/dL — ABNORMAL LOW (ref >=40)
LDL Cholesterol: 57 mg/dL (ref 0.0–100.0)
LDL/HDL Ratio: 1.5
Triglycerides: 110 mg/dL (ref 0–149)
VLDL: 22 mg/dL (ref 5.0–40.0)

## 2022-06-24 LAB — PSA SCREENING: Screening PSA: 0.64 ng/mL (ref 0.000–4.000)

## 2022-06-24 LAB — IRON AND TIBC
Iron Saturation: 31 % (ref 20–40)
Iron: 103 ug/dL (ref 59–158)
TIBC: 332 ug/dL (ref 250–450)
UIBC: 229 ug/dL (ref 112.0–347.0)

## 2022-06-24 LAB — FERRITIN: Ferritin: 109 ng/mL (ref 30.0–400.0)

## 2022-06-24 LAB — VITAMIN B12: Vitamin B-12: 673 pg/mL (ref 232–1245)

## 2022-06-24 LAB — FOLATE: Folate: >20 ng/mL (ref 4.80–24.20)

## 2022-06-24 LAB — MAGNESIUM: Magnesium: 2.2 mg/dL (ref 1.6–2.6)

## 2022-06-24 LAB — TESTOSTERONE: Testosterone: 513 ng/dL (ref 193.0–740.0)

## 2022-07-01 LAB — TESTOSTERONE, FREE: Testosterone,Free: 6.8 pg/mL (ref 6.6–18.1)

## 2022-07-06 LAB — AMB POC FECAL BLOOD, OCCULT, QL 3 CARDS
Fecal Occult Blood #2, POC: NEGATIVE
Fecal Occult Blood #3, POC: NEGATIVE
Fecal Occult Blood, POC: NEGATIVE

## 2022-09-07 ENCOUNTER — Encounter

## 2022-12-27 ENCOUNTER — Encounter: Payer: PRIVATE HEALTH INSURANCE | Attending: Family Medicine | Primary: Family Medicine

## 2023-01-07 NOTE — Telephone Encounter (Signed)
addressed

## 2023-01-17 ENCOUNTER — Encounter: Attending: Family Medicine | Primary: Family Medicine

## 2023-02-28 ENCOUNTER — Encounter: Payer: PRIVATE HEALTH INSURANCE | Attending: Family Medicine | Primary: Family Medicine

## 2023-02-28 MED ORDER — ALBUTEROL SULFATE HFA 108 (90 BASE) MCG/ACT IN AERS
108 | RESPIRATORY_TRACT | 2 refills | Status: AC | PRN
Start: 2023-02-28 — End: ?

## 2023-02-28 MED ORDER — CHLORTHALIDONE 25 MG PO TABS
25 MG | ORAL_TABLET | ORAL | 1 refills | Status: DC
Start: 2023-02-28 — End: 2023-09-15

## 2023-02-28 MED ORDER — AMLODIPINE BESYLATE 5 MG PO TABS
5 MG | ORAL_TABLET | ORAL | 1 refills | Status: DC
Start: 2023-02-28 — End: 2023-09-15

## 2023-02-28 MED ORDER — ROSUVASTATIN CALCIUM 10 MG PO TABS
10 | ORAL_TABLET | ORAL | 1 refills | Status: AC
Start: 2023-02-28 — End: ?

## 2023-02-28 MED ORDER — EDARBI 80 MG PO TABS
80 | ORAL_TABLET | ORAL | 1 refills | Status: AC
Start: 2023-02-28 — End: ?

## 2023-03-21 ENCOUNTER — Encounter: Payer: PRIVATE HEALTH INSURANCE | Attending: Family Medicine | Primary: Family Medicine

## 2023-04-04 ENCOUNTER — Inpatient Hospital Stay: Admit: 2023-04-04 | Payer: PRIVATE HEALTH INSURANCE | Primary: Family Medicine

## 2023-04-04 ENCOUNTER — Ambulatory Visit
Admit: 2023-04-04 | Discharge: 2023-04-04 | Payer: PRIVATE HEALTH INSURANCE | Attending: Physician Assistant | Primary: Family Medicine

## 2023-04-04 DIAGNOSIS — M549 Dorsalgia, unspecified: Secondary | ICD-10-CM

## 2023-04-04 DIAGNOSIS — I10 Essential (primary) hypertension: Secondary | ICD-10-CM

## 2023-04-04 NOTE — Progress Notes (Signed)
CHIEF COMPLAINT:  Chief Complaint   Patient presents with    Follow-up        HISTORY OF PRESENT ILLNESS:  Timothy Velazquez is a 67 y.o. male  With a hx of hypertension, hyperlipidemia, seasonal allergies, and psoriasis presents to discuss low blood pressure reading for the past 6 month and mid right back pain     Right mid back pain x 83mo  No numbness, tingling, injury. Ibu helps. Rarely impacts sleep.    Some lightheadedness/ dizziness upon standing  once a week x 83mo Stays hydrated.     He reports snoring and daytime somnolence. Around 2:30pm every day he gets very fatigued. Never had a sleep study     Below is the assessment and plan developed based on review of pertinent history, physical exam, labs, studies, and medications.    IMPRESSION/PLAN:         1. Essential (primary) hypertension   Given in office BP readings will hold off on making any changes at this time. Continue same and monitor at home. Increase fluids to stay hydrated. Follow up with Dr. Martina Sinner in 1 month - bring BP cuff and readings. Discussed if still low may need to consider decr chlorthalidone to 1/2 tab  2. Mid back pain on right side  -     [facility] - [facility] - [place]  -     XR THORACIC SPINE (3 VIEWS); Future  -     XR LUMBAR SPINE (2-3 VIEWS); Future  Will get XR and ref to PT.  3. Mixed hyperlipidemia  4. Other seasonal allergic rhinitis  5. Fibromyalgia  6. Psoriasis  7. Seasonal allergies  8. Immunization declined  9. Daytime somnolence  -     Amb External Referral To Sleep Medicine  Discussed if sleep study positive for OSA may need CPAP, ENT ref or dental appliance. Will follow up based on results  10. Snoring  -     Amb External Referral To Sleep Medicine  11. Anemia, unspecified type   Will get non-fasting labs 1 wk before next visit  12. Immunization counseling   Declines RSV and pneumonia vaccines    Vaccine Counseling/ Health Maintenance:  reviewed age appropriate health screenings and immunizations.  Discussed importance, risks, and benefits of immunizations. Recommend/ordered deficient screenings and immunizations.     Immunization History   Administered Date(s) Administered    COVID-19, PFIZER PURPLE top, DILUTE for use, (age 6 y+), 36mcg/0.3mL 01/24/2020, 02/14/2020    TD 5LF, TENIVAC, (age 7y+), IM, 0.71mL 11/16/2011, 06/23/2022        PHQ:      06/23/2022     8:37 AM   PHQ-9    Little interest or pleasure in doing things 0   Feeling down, depressed, or hopeless 0   Trouble falling or staying asleep, or sleeping too much 0   Feeling tired or having little energy 0   Poor appetite or overeating 0   Feeling bad about yourself - or that you are a failure or have let yourself or your family down 0   Trouble concentrating on things, such as reading the newspaper or watching television 0   Moving or speaking so slowly that other people could have noticed. Or the opposite - being so fidgety or restless that you have been moving around a lot more than usual 0   Thoughts that you would be better off dead, or of hurting yourself in some way 0  PHQ-2 Score 0   PHQ-9 Total Score 0       CURRENT MEDICATION LIST:  Controlled substances monitoring: not applicable  Prior to Admission medications    Medication Sig Start Date End Date Taking? Authorizing Provider   SKYRIZI PEN 150 MG/ML SOAJ  01/12/23  Yes [provider]   albuterol sulfate HFA (VENTOLIN HFA) 108 (90 Base) MCG/ACT inhaler Inhale 2 puffs into the lungs every 4 hours as needed for Wheezing 02/28/23  Yes Tera Partridge, MD   chlorthalidone (HYGROTON) 25 MG tablet 1 tablet in the morning with food Orally Once a day for 90 days 02/28/23  Yes Tera Partridge, MD   rosuvastatin (CRESTOR) 10 MG tablet 1 tablet Orally Once a day for 90 days 02/28/23  Yes Tera Partridge, MD   amLODIPine (NORVASC) 5 MG tablet 1 tablet Orally Once a day for 90 day(s) 02/28/23  Yes Tera Partridge, MD   Azilsartan Medoxomil (EDARBI) 80 MG TABS 1 tablet Orally Once a day for 90  day(s) 02/28/23  Yes Tera Partridge, MD   ibuprofen (ADVIL;MOTRIN) 800 MG tablet Take 1 tablet by mouth 2 times daily as needed for Pain (take medication with food or milk) 05/31/22 04/04/23 Yes Tera Partridge, MD   azelastine (ASTELIN) 0.1 % nasal spray 2 sprays by Nasal route 2 times daily 12/28/21 04/04/23 Yes Tera Partridge, MD   fluticasone [facility]) 50 MCG/ACT nasal spray 2 sprays by Each Nostril route daily 07/14/21  Yes Tera Partridge, MD   fluticasone (FLONASE) 50 MCG/ACT nasal spray 2 sprays by Each Nostril route in the morning and 2 sprays in the evening. 10/22/21   Coralee Pesa, APRN - NP   adalimumab (HUMIRA) 40 MG/0.8ML injection 0.8 ml Subcutaneous q2weeks  Patient not taking: Reported on 04/04/2023    [facility] Automatic Reconciliation, [facility], MD   sildenafil (REVATIO) 20 MG tablet 1-5 tablets Orally 1 hour prior to sexual activity  Patient not taking: Reported on 06/23/2022 06/23/20   [facility] Automatic Reconciliation, [facility], MD        ALLERGIES:  Allergies   Allergen Reactions    Tetanus Toxoid Other (See Comments)        HISTORY:  Past Medical History:   Diagnosis Date    Allergic rhinitis     Asthma     Atrial fibrillation (HCC)     Fibromyalgia     Headache     Hyperlipidemia     Hypertension     Psoriasis       Past Surgical History:   Procedure Laterality Date    PILONIDAL CYST EXCISION        Social History     Socioeconomic History    Marital status: Married     Spouse name: Not on file    Number of children: Not on file    Years of education: Not on file    Highest education level: Not on file   Occupational History    Not on file   Tobacco Use    Smoking status: Never     Passive exposure: Never    Smokeless tobacco: Never   Vaping Use    Vaping Use: Never used   Substance and Sexual Activity    Alcohol use: Yes     Alcohol/week: 2.0 standard drinks of alcohol     Types: 2 Drinks containing 0.5 oz of alcohol per week     Comment: Usually have a cocktail with dinner  on a Friday or Saturday    Drug use:  Never    Sexual activity: Not Currently     Partners: Female   Other Topics Concern    Not on file   Social History Narrative    Not on file     Social Determinants of Health     Financial Resource Strain: Not on file   Food Insecurity: Not on file   Transportation Needs: Not on file   Physical Activity: Not on file   Stress: Not on file   Social Connections: Not on file   Intimate Partner Violence: Not on file   Housing Stability: Not on file      Family History   Problem Relation Age of Onset    High Blood Pressure Mother     Asthma Mother     Alzheimer's Disease Father     Dementia Father     Alcohol Abuse Brother         REVIEW OF SYSTEMS:  Pertinent items are noted in HPI.  PHYSICAL EXAM:  Vital Signs -   Vitals:    04/04/23 1039   BP: 133/77   Pulse: 56   Temp: 97.8 F (36.6 C)   Weight: 83.9 kg (185 lb)   Height: 1.727 m (5\' 8" )     Body mass index is 28.13 kg/m.       Marland KitchenPhysical Exam  Vitals reviewed.   Constitutional:       General: He is not in acute distress.     Appearance: Normal appearance. He is well-developed.   HENT:      Head: Normocephalic and atraumatic.      Nose: Nose normal.      Mouth/Throat:      Mouth: Mucous membranes are dry.      Pharynx: Oropharynx is clear.   Eyes:      Extraocular Movements: Extraocular movements intact.      Conjunctiva/sclera: Conjunctivae normal.      Pupils: Pupils are equal, round, and reactive to light.   Cardiovascular:      Rate and Rhythm: Normal rate and regular rhythm.      Heart sounds: No murmur heard.  Pulmonary:      Effort: Pulmonary effort is normal.      Breath sounds: Normal breath sounds.   Musculoskeletal:      Cervical back: Neck supple.   Skin:     General: Skin is warm and dry.   Neurological:      General: No focal deficit present.      Mental Status: He is alert and oriented to person, place, and time.   Psychiatric:         Behavior: Behavior normal.         Thought Content: Thought content normal.           LABS  No results found for this  visit on 04/04/23.      Follow up and Dispositions:  Orders Placed This Encounter    XR THORACIC SPINE (3 VIEWS)     Standing Status:   Future     Standing Expiration Date:   04/03/2024    XR LUMBAR SPINE (2-3 VIEWS)     Standing Status:   Future     Standing Expiration Date:   04/03/2024    [facility] - [place], [place]     Referral Priority:   Routine     Referral Type:   Eval  and Treat     Referral Reason:   Specialty Services Required     Referral Location:   PT/OT, [facility]     Requested Specialty:   Physical Therapist     Number of Visits Requested:   1    Amb External Referral To Sleep Medicine     Referral Priority:   Routine     Referral Type:   Eval and Treat     Referral Reason:   Specialty Services Required     Requested Specialty:   Sleep Medicine Family Practice     Number of Visits Requested:   1    SKYRIZI PEN 150 MG/ML SOAJ      Return in about 4 weeks (around 05/02/2023) for f/u BP with non-fasting labs 1wk prior.       Danae Chen, PA    Please note that portions of this note were completed with computer software and voice recognition program. Although all efforts were made to edit the dictations for accuracy, there may be errors in transcription that are not intended.

## 2023-04-22 MED ORDER — IBUPROFEN 800 MG PO TABS
800 MG | ORAL_TABLET | Freq: Two times a day (BID) | ORAL | 1 refills | Status: AC | PRN
Start: 2023-04-22 — End: 2023-10-19

## 2023-04-22 MED ORDER — FLUTICASONE PROPIONATE 50 MCG/ACT NA SUSP
50 MCG/ACT | Freq: Every day | NASAL | 3 refills | Status: AC
Start: 2023-04-22 — End: ?

## 2023-04-22 MED ORDER — AZELASTINE HCL 0.1 % NA SOLN
0.1 % | Freq: Two times a day (BID) | NASAL | 3 refills | Status: AC
Start: 2023-04-22 — End: 2023-07-21

## 2023-04-29 ENCOUNTER — Encounter

## 2023-04-29 LAB — COMPREHENSIVE METABOLIC PANEL
ALT: 37 U/L (ref 0–50)
AST: 28 U/L (ref 0–50)
Albumin/Globulin Ratio: 1.9 (ref 1.00–2.70)
Albumin: 4.7 g/dL (ref 3.5–5.2)
Alk Phosphatase: 60 U/L (ref 40–130)
Anion Gap: 13 mmol/L (ref 2–17)
BUN: 25 mg/dL — ABNORMAL HIGH (ref 8–23)
CO2: 25 mmol/L (ref 22–29)
Calcium: 9.7 mg/dL (ref 8.5–10.7)
Chloride: 103 mmol/L (ref 98–107)
Creatinine: 1.3 mg/dL (ref 0.7–1.3)
Est, Glom Filt Rate: 61 mL/min/1.73m (ref >=60)
Globulin: 2.5 g/dL (ref 1.9–4.4)
Glucose: 105 mg/dL — ABNORMAL HIGH (ref 70–99)
Osmolaliy Calculated: 286 mOsm/kg (ref 270–287)
Potassium: 4.1 mmol/L (ref 3.5–5.3)
Sodium: 141 mmol/L (ref 135–145)
Total Bilirubin: 0.45 mg/dL (ref 0.00–1.20)
Total Protein: 7.2 g/dL (ref 5.7–8.3)

## 2023-04-29 LAB — IRON AND TIBC
Iron % Saturation: 29 % (ref 20–40)
Iron: 91 ug/dL (ref 59–158)
TIBC: 314 ug/dL (ref 250–450)
UIBC: 223 ug/dL (ref 112.0–347.0)

## 2023-04-29 LAB — FERRITIN: Ferritin: 146 ng/mL (ref 30.0–400.0)

## 2023-04-29 LAB — FOLATE: Folate: >20 ng/mL (ref 4.80–24.20)

## 2023-04-29 LAB — VITAMIN B12: Vitamin B-12: 656 pg/mL (ref 232–1245)

## 2023-05-05 LAB — QUANTIFERON TB GOLD PLUS
QuantiFERON Mitogen: >10 IU/mL
QuantiFERON Nil: 0.09 IU/mL
QuantiFERON TB1 Ag Value: 0.05 IU/mL
QuantiFERON TB2 Ag Value: 0.05 IU/mL

## 2023-05-05 LAB — QUANTIFERON (R) TB GOLD: Quantiferon TB Gold Plus: NEGATIVE

## 2023-05-16 ENCOUNTER — Encounter: Primary: Family Medicine

## 2023-05-23 ENCOUNTER — Encounter: Attending: Family Medicine | Primary: Family Medicine

## 2023-07-02 ENCOUNTER — Encounter

## 2023-07-06 NOTE — Telephone Encounter (Signed)
 Requested Prescriptions     Pending Prescriptions Disp Refills    Azilsartan Medoxomil (EDARBI ) 80 MG TABS 90 tablet 0     Sig: 1 tablet Orally Once a day for 90 day(s)      LV 04/04/23, F/U sched 07/25/23, last note states f/u , I called pt sched sooner appt, pt declined requesting I ask if medication can be filled, rx cued

## 2023-07-06 NOTE — Addendum Note (Signed)
 Addended by: Wilmon Arms on: 07/06/2023 03:31 PM     Modules accepted: Orders

## 2023-07-07 ENCOUNTER — Encounter

## 2023-07-07 MED ORDER — EDARBI 80 MG PO TABS
80 | ORAL_TABLET | ORAL | 1 refills | Status: DC
Start: 2023-07-07 — End: 2023-09-15

## 2023-07-07 NOTE — Telephone Encounter (Signed)
 Refill sent today.

## 2023-07-07 NOTE — Addendum Note (Signed)
 Addended by: Danae Chen on: 07/07/2023 09:24 AM     Modules accepted: Orders

## 2023-07-25 ENCOUNTER — Encounter: Payer: PRIVATE HEALTH INSURANCE | Attending: Family Medicine | Primary: Family Medicine

## 2023-09-13 NOTE — Telephone Encounter (Signed)
 Needs appt

## 2023-09-15 ENCOUNTER — Ambulatory Visit
Admit: 2023-09-15 | Discharge: 2023-09-15 | Payer: PRIVATE HEALTH INSURANCE | Attending: Physician Assistant | Primary: Family Medicine

## 2023-09-15 VITALS — BP 120/76 | Temp 98.50000°F | Ht 67.0 in | Wt 174.0 lb

## 2023-09-15 DIAGNOSIS — I10 Essential (primary) hypertension: Secondary | ICD-10-CM

## 2023-09-15 LAB — AMB POC URINALYSIS DIP STICK AUTO W/O MICRO
Bilirubin, Urine, POC: NEGATIVE
Blood (UA POC): NEGATIVE
Glucose, Urine, POC: NEGATIVE
Ketones, Urine, POC: NEGATIVE
Leukocyte Esterase, Urine, POC: NEGATIVE
Nitrite, Urine, POC: NEGATIVE
Protein, Urine, POC: NEGATIVE
Specific Gravity, Urine, POC: 1.015 (ref 1.001–1.035)
Urobilinogen, POC: 0.2 mg/dL (ref <1.1)
pH, Urine, POC: 6.5 (ref 4.6–8.0)

## 2023-09-15 LAB — PSA SCREENING: PSA, Screening: 0.642 ng/mL (ref 0.000–4.000)

## 2023-09-15 LAB — CBC WITH AUTO DIFFERENTIAL
Basophils %: 1.7 % (ref 0.0–2.0)
Basophils Absolute: 0.1 10*3/uL (ref 0.0–0.2)
Eosinophils %: 4.6 % (ref 0.0–7.0)
Eosinophils Absolute: 0.2 10*3/uL (ref 0.0–0.5)
Hematocrit: 38.8 % (ref 38.0–52.0)
Hemoglobin: 12 g/dL — ABNORMAL LOW (ref 13.0–17.3)
Immature Grans (Abs): 0.01 10*3/uL (ref 0.00–0.06)
Immature Granulocytes %: 0.2 % (ref 0.0–0.6)
Lymphocytes Absolute: 1.5 10*3/uL (ref 1.0–3.2)
Lymphocytes: 30.3 % (ref 15.0–45.0)
MCH: 21.1 pg — ABNORMAL LOW (ref 27.0–34.5)
MCHC: 30.9 g/dL (ref 30.0–36.0)
MCV: 68.1 fL — ABNORMAL LOW (ref 84.0–100.0)
MPV: 11 fL (ref 7.0–12.2)
Monocytes %: 8.4 % (ref 4.0–12.0)
Monocytes Absolute: 0.4 10*3/uL (ref 0.3–1.0)
NRBC Absolute: 0 10*3/uL (ref 0.000–0.012)
NRBC Automated: 0 % (ref 0.0–0.2)
Neutrophils %: 54.8 % (ref 42.0–74.0)
Neutrophils Absolute: 2.6 10*3/uL (ref 1.6–7.3)
Platelets: 255 10*3/uL (ref 140–440)
RBC: 5.7 x10e6/mcL — ABNORMAL HIGH (ref 4.00–5.60)
RDW: 15.5 % (ref 10.0–17.0)
WBC: 4.8 10*3/uL (ref 3.8–10.6)

## 2023-09-15 LAB — HEMOGLOBIN A1C
Estimated Avg Glucose: 117
Estimated Avg Glucose: 126
Hemoglobin A1C: 5.7 % (ref 4.0–6.0)

## 2023-09-15 LAB — COMPREHENSIVE METABOLIC PANEL
ALT: 39 U/L (ref 0–42)
AST: 32 U/L (ref 0–46)
Albumin/Globulin Ratio: 1.6 (ref 1.00–2.70)
Albumin: 4.5 g/dL (ref 3.5–5.2)
Alk Phosphatase: 60 U/L (ref 40–130)
Anion Gap: 11 mmol/L (ref 2–17)
BUN: 27 mg/dL — ABNORMAL HIGH (ref 8–23)
CO2: 27 mmol/L (ref 22–29)
Calcium: 10.4 mg/dL (ref 8.5–10.7)
Chloride: 103 mmol/L (ref 98–107)
Creatinine: 1.5 mg/dL — ABNORMAL HIGH (ref 0.7–1.3)
Est, Glom Filt Rate: 51 mL/min/1.73mÂ² — ABNORMAL LOW (ref >=60)
Globulin: 2.8 g/dL (ref 1.9–4.4)
Glucose: 110 mg/dL — ABNORMAL HIGH (ref 70–99)
Osmolaliy Calculated: 287 mosm/kg (ref 270–287)
Potassium: 4.6 mmol/L (ref 3.5–5.3)
Sodium: 141 mmol/L (ref 135–145)
Total Bilirubin: 0.47 mg/dL (ref 0.00–1.20)
Total Protein: 7.3 g/dL (ref 5.7–8.3)

## 2023-09-15 LAB — LIPID PANEL
Chol/HDL Ratio: 3 (ref 0.0–4.4)
Cholesterol, Total: 131 mg/dL (ref 100–200)
HDL: 44 mg/dL (ref >=40)
LDL Cholesterol: 69 mg/dL (ref 0.0–100.0)
LDL/HDL Ratio: 1.6
Triglycerides: 90 mg/dL (ref 0–149)
VLDL: 18 mg/dL (ref 5.0–40.0)

## 2023-09-15 LAB — FERRITIN: Ferritin: 114 ng/mL (ref 30.0–400.0)

## 2023-09-15 LAB — FOLATE: Folate: >20 ng/mL (ref 4.80–24.20)

## 2023-09-15 LAB — VITAMIN B12: Vitamin B-12: 662 pg/mL (ref 232–1245)

## 2023-09-15 LAB — TSH WITH REFLEX: TSH: 1.28 u[IU]/mL (ref 0.358–3.740)

## 2023-09-15 LAB — IRON AND TIBC
Iron % Saturation: 33 % (ref 20–40)
Iron: 101 ug/dL (ref 59–158)
TIBC: 306 ug/dL (ref 250–450)
UIBC: 205 ug/dL (ref 112.0–347.0)

## 2023-09-15 MED ORDER — AMLODIPINE BESYLATE 10 MG PO TABS
10 | ORAL_TABLET | Freq: Every day | ORAL | 1 refills | Status: AC
Start: 2023-09-15 — End: ?

## 2023-09-15 MED ORDER — TAMSULOSIN HCL 0.4 MG PO CAPS
0.4 | ORAL_CAPSULE | Freq: Every day | ORAL | 1 refills | Status: AC
Start: 2023-09-15 — End: ?

## 2023-09-15 MED ORDER — CHLORTHALIDONE 25 MG PO TABS
25 | ORAL_TABLET | Freq: Every day | ORAL | 1 refills | Status: AC
Start: 2023-09-15 — End: ?

## 2023-09-15 MED ORDER — EDARBI 80 MG PO TABS
80 | ORAL_TABLET | ORAL | 1 refills | Status: AC
Start: 2023-09-15 — End: ?

## 2023-09-15 MED ORDER — CEFDINIR 300 MG PO CAPS
300 | ORAL_CAPSULE | Freq: Two times a day (BID) | ORAL | 0 refills | Status: AC
Start: 2023-09-15 — End: 2023-09-25

## 2023-09-15 NOTE — Progress Notes (Signed)
 CHIEF COMPLAINT:  Chief Complaint   Patient presents with    Hypertension    Ear Pain        HISTORY OF PRESENT ILLNESS:  Timothy Velazquez is a 67 y.o. male with history of hypertension, hyperlipidemia, fibromyalgia, and psoriasis presents today to follow-u

## 2023-09-15 NOTE — Assessment & Plan Note (Signed)
 chronic follow-up rheumatology

## 2023-09-15 NOTE — Assessment & Plan Note (Addendum)
 Chronic stable well-controlled.  Fasting labs to be updated today    Orders:    Lipid Panel; Future    Lipid Panel

## 2023-09-15 NOTE — Patient Instructions (Addendum)
-   Decrease chlorthalidone 25mg  from 1 to 1/2 tablet daily  - Increase amlodipine 5mg  daily to 10mg  daily.   - Start tamsulosin for night time urinating.  - Start antibiotics for sinus infection. Also recommend using flonase nasal spray. Call  if worse  - U

## 2023-09-15 NOTE — Assessment & Plan Note (Addendum)
 Chronic stable however due to the nocturia we will try adjusting his medications.  Will decrease chlorthalidone to half a tablet and increase amlodipine from 5 to 10 mg daily.  Patient monitors blood pressure and follow-up in about a month.  He will contac

## 2023-09-15 NOTE — Assessment & Plan Note (Addendum)
 will get updated labs to check Hemoccult cards.  May need to see hematology.  Appears he has been referred there in the past.  He declines colonoscopy    Orders:    Vitamin B12; Future    Iron and TIBC; Future    Folate; Future    Ferritin; Future    AMB

## 2023-09-15 NOTE — Assessment & Plan Note (Addendum)
 Will trial Flomax.  We also get updated PSA    Orders:    tamsulosin (FLOMAX) 0.4 MG capsule; Take 1 capsule by mouth daily

## 2023-09-16 ENCOUNTER — Encounter: Admit: 2023-09-16 | Admitting: Physician Assistant

## 2023-09-16 DIAGNOSIS — D649 Anemia, unspecified: Secondary | ICD-10-CM

## 2023-09-16 LAB — MORPHOLOGY CHECK: RBC Morphology: ABNORMAL — AB

## 2023-09-23 ENCOUNTER — Encounter: Admit: 2023-09-23

## 2023-09-23 DIAGNOSIS — D649 Anemia, unspecified: Secondary | ICD-10-CM

## 2023-09-23 LAB — AMB POC FECAL BLOOD, OCCULT, QL 3 CARDS
Fecal Occult Blood #2, POC: NEGATIVE
Fecal Occult Blood #3, POC: NEGATIVE
Fecal Occult Blood, POC: NEGATIVE

## 2023-09-23 NOTE — Progress Notes (Signed)
 Patient notified hem cards neg x 3.

## 2023-11-04 ENCOUNTER — Ambulatory Visit
Admit: 2023-11-04 | Discharge: 2023-11-04 | Payer: PRIVATE HEALTH INSURANCE | Attending: Family | Admitting: Family | Primary: Family Medicine

## 2023-11-04 VITALS — BP 124/70 | HR 62 | Temp 98.80000°F | Wt 173.0 lb

## 2023-11-04 DIAGNOSIS — J069 Acute upper respiratory infection, unspecified: Secondary | ICD-10-CM

## 2023-11-04 LAB — AMB POC COVID-19 & INFLUENZA A/B
INFLUENZA A RNA, POC: NOT DETECTED
INFLUENZA B RNA, POC: NOT DETECTED
SARS-COV-2 RNA, POC: NEGATIVE

## 2023-11-04 LAB — AMB POC RAPID STREP A: Group A Strep Antigen, POC: NEGATIVE

## 2023-11-04 MED ORDER — PREDNISONE 20 MG PO TABS
20 | ORAL_TABLET | Freq: Every day | ORAL | 0 refills | Status: AC
Start: 2023-11-04 — End: 2023-11-09

## 2023-11-04 MED ORDER — IPRATROPIUM BROMIDE 0.06 % NA SOLN
0.06 | Freq: Three times a day (TID) | NASAL | 0 refills | Status: DC | PRN
Start: 2023-11-04 — End: 2024-10-10

## 2023-11-04 NOTE — Progress Notes (Signed)
 Subjective:      Patient ID: Timothy Velazquez     Chief Complaint Cough (X 3 days ), Ear Pain, Pharyngitis, and Nasal Congestion       Time Patient seen by Provider: 4:45 PM    HPI The patient is a 68 y.o. male presents with complaints of postnasal drainage, sore throat, and cough that began 3 days ago.  Patient states that since last night he has been experiencing episodes in which he develops a coughing fit and feels as if his throat is closing up.  He states that this leaves him gasping.  No shortness of breath while at rest.  No fevers.    Review of Systems: As noted in the HPI    Past Medical History:   Diagnosis Date    Allergic rhinitis     Asthma     Atrial fibrillation (HCC)     Fibromyalgia     Headache     Hyperlipidemia     Hypertension     New daily persistent headache 10/22/2021    Psoriasis         Past Surgical History:   Procedure Laterality Date    PILONIDAL CYST EXCISION          Allergies   Allergen Reactions    Tetanus Toxoid Other (See Comments)        Current Outpatient Medications   Medication Sig Dispense Refill    ipratropium (ATROVENT ) 0.06 % nasal spray 2 sprays by Each Nostril route 3 times daily as needed for Rhinitis 1 each 0    predniSONE  (DELTASONE ) 20 MG tablet Take 2 tablets by mouth daily for 5 days 10 tablet 0    Azilsartan Medoxomil (EDARBI ) 80 MG TABS 1 tablet Orally Once a day for 90 day(s) 90 tablet 1    amLODIPine  (NORVASC ) 10 MG tablet Take 1 tablet by mouth daily 90 tablet 1    chlorthalidone  (HYGROTON ) 25 MG tablet Take 0.5 tablets by mouth daily 1 tablet in the morning with food Orally Once a day for 90 days 45 tablet 1    tamsulosin  (FLOMAX ) 0.4 MG capsule Take 1 capsule by mouth daily 90 capsule 1    albuterol  sulfate HFA (VENTOLIN  HFA) 108 (90 Base) MCG/ACT inhaler Inhale 2 puffs into the lungs every 4 hours as needed for Wheezing 1 each 2    rosuvastatin  (CRESTOR ) 10 MG tablet 1 tablet Orally Once a day for 90 days 90 tablet 1    fluticasone  (FLONASE ) 50 MCG/ACT  nasal spray 2 sprays by Each Nostril route daily (Patient not taking: Reported on 09/15/2023) 3 each 3    azelastine  (ASTELIN ) 0.1 % nasal spray 2 sprays by Nasal route 2 times daily 3 each 3    ibuprofen  (ADVIL ;MOTRIN ) 800 MG tablet Take 1 tablet by mouth 2 times daily as needed for Pain (take medication with food or milk) 180 tablet 1    SKYRIZI PEN 150 MG/ML SOAJ       fluticasone  (FLONASE ) 50 MCG/ACT nasal spray 2 sprays by Each Nostril route in the morning and 2 sprays in the evening. (Patient not taking: Reported on 11/04/2023) 16 g 5    adalimumab (HUMIRA) 40 MG/0.8ML injection 0.8 ml Subcutaneous q2weeks (Patient not taking: Reported on 04/04/2023)      sildenafil (REVATIO) 20 MG tablet 1-5 tablets Orally 1 hour prior to sexual activity (Patient not taking: Reported on 06/23/2022)       No current facility-administered medications for this visit.  BP 124/70 (Site: Left Upper Arm, Position: Sitting, Cuff Size: Medium Adult)   Pulse 62   Temp 98.8 F (37.1 C) (Oral)   Wt 78.5 kg (173 lb)   SpO2 96%   BMI 27.10 kg/m       No results found.    Objective:   Physical Exam  Vitals and nursing note reviewed.   Constitutional:       General: He is not in acute distress.     Appearance: Normal appearance. He is not ill-appearing, toxic-appearing or diaphoretic.   HENT:      Head: Normocephalic and atraumatic.      Right Ear: Tympanic membrane, ear canal and external ear normal.      Left Ear: Tympanic membrane, ear canal and external ear normal.      Nose: Congestion and rhinorrhea present.      Mouth/Throat:      Mouth: Mucous membranes are moist.      Pharynx: Oropharynx is clear. Posterior oropharyngeal erythema present. No oropharyngeal exudate.   Eyes:      Extraocular Movements: Extraocular movements intact.      Conjunctiva/sclera: Conjunctivae normal.   Cardiovascular:      Rate and Rhythm: Normal rate and regular rhythm.      Heart sounds: Normal heart sounds. No murmur heard.     No friction rub. No  gallop.   Pulmonary:      Effort: Pulmonary effort is normal. No respiratory distress.      Breath sounds: Normal breath sounds. No stridor. No wheezing, rhonchi or rales.   Musculoskeletal:         General: Normal range of motion.      Cervical back: Normal range of motion and neck supple.   Skin:     General: Skin is warm and dry.   Neurological:      General: No focal deficit present.      Mental Status: He is alert and oriented to person, place, and time.   Psychiatric:         Mood and Affect: Mood normal.         Behavior: Behavior normal.         Thought Content: Thought content normal.         Judgment: Judgment normal.           Assessment:   1. Viral URI  2. Post-nasal drainage  -     ipratropium (ATROVENT ) 0.06 % nasal spray; 2 sprays by Each Nostril route 3 times daily as needed for Rhinitis, Disp-1 each, R-0Normal  3. Sore throat  -     AMB POC COVID-19 & INFLUENZA A/B  -     AMB POC RAPID STREP A  4. Bronchospasm  -     predniSONE  (DELTASONE ) 20 MG tablet; Take 2 tablets by mouth daily for 5 days, Disp-10 tablet, R-0Normal       Plan:     Results for orders placed or performed in visit on 11/04/23   AMB POC COVID-19 & INFLUENZA A/B   Result Value Ref Range    SARS-COV-2 RNA, POC Negative     INFLUENZA A RNA, POC Not-Detected     INFLUENZA B RNA, POC Not-Detected     Internal Control Valid Internal Control     Lot number swab      EXP date swab      LOT NUMBER POC      EXPIRATION DATE      Lot  number solution      EXP date solution     AMB POC RAPID STREP A   Result Value Ref Range    Valid Internal Control, POC Valid Internal Control     Group A Strep Antigen, POC Negative       COVID, influenza, and strep testing negative.  Lung sounds are clear to auscultation on exam.  No swelling noted in the posterior pharynx.  Patient appears in no acute distress.  Suspect the patient is experiencing bronchospasms.  Will treat with prednisone  to help alleviate inflammation.  In addition, postnasal drainage will  be managed with ipratropium nasal spray.  Instructed on how to administer medications and possible side effects.  We did discuss over-the-counter symptomatic management.  Maintain adequate hydration.  Follow-up for any new or worsening symptoms.  The patient verbalized understanding and agreement with plan of care.    Comer FORBES Patterson, APRN - NP    I have reviewed prior visit notes and lab results pertinent to this visit. Point of care results and imaging were independently interpreted by myself and discussed with the patient. Educated the patient on disease process, expected course of illness, and management. Educated the patient on how to administer prescribed medications and possible side effects.  Patient instructed to follow-up immediately for any new or worsening symptoms.  Patient verbalized understanding and agreement with plan of care.    Please note that this note was generated using voice recognition Scientist, clinical (histocompatibility and immunogenetics).  Although every effort was made to ensure the accuracy of this automated transcription, some errors in transcription may have occurred.

## 2023-11-14 MED ORDER — IBUPROFEN 800 MG PO TABS
800 | ORAL_TABLET | Freq: Two times a day (BID) | ORAL | 1 refills | Status: AC | PRN
Start: 2023-11-14 — End: 2024-05-12

## 2023-11-14 NOTE — Telephone Encounter (Signed)
Requested Prescriptions     Pending Prescriptions Disp Refills    ibuprofen (ADVIL;MOTRIN) 800 MG tablet 180 tablet 1     Sig: Take 1 tablet by mouth 2 times daily as needed for Pain (take medication with food or milk)      [facility] 09/15/23, F/U 03/05/24, rx cued if approved

## 2024-01-28 ENCOUNTER — Encounter

## 2024-02-01 NOTE — Telephone Encounter (Signed)
 Left voicemail for patient; he needs an appointment for some abnormal blood work; cancelled appointment that I made for him.                                Karie Soda  [employer] (9:53 AM)     Ottis Stain  Please assist.  Thank you        Larna Daughters  P [facility] Desk Staff1 hour ago (8:40 AM)       Appointment canceled for VERTIS SCHEIB ([id number])  Visit type: OFFICE VISIT  02/06/2024 3:40 PM (20 minutes) with Dr. Yolonda Kida, MD in [facility]     Reason for cancellation: Error     Patient comments: I need refills sent in as I have enough for 2 days. I don't need an appointment as I was just there for visit 4 months ago.

## 2024-02-06 ENCOUNTER — Encounter: Attending: Family Medicine | Primary: Family Medicine

## 2024-02-29 ENCOUNTER — Encounter: Payer: PRIVATE HEALTH INSURANCE | Attending: Family Medicine | Primary: Family Medicine

## 2024-03-05 ENCOUNTER — Encounter: Payer: PRIVATE HEALTH INSURANCE | Attending: Physician Assistant | Primary: Family Medicine

## 2024-03-08 ENCOUNTER — Encounter

## 2024-03-27 ENCOUNTER — Encounter: Payer: PRIVATE HEALTH INSURANCE | Attending: Family Medicine | Primary: Family Medicine

## 2024-03-28 NOTE — Patient Instructions (Incomplete)
 with a hx of hypertension, hyperlipidemia, fibromyalgia, and psoriasis. Pt presents today for BP check and medication refills.    Pt taking edarbi  80mg , amlodipine  10mg , and chlor 25mg  0.5 tablet daily for hypertension. He does monitor BP at home.

## 2024-03-29 ENCOUNTER — Ambulatory Visit
Admit: 2024-03-29 | Discharge: 2024-03-29 | Payer: PRIVATE HEALTH INSURANCE | Attending: Family Medicine | Primary: Family Medicine

## 2024-03-29 VITALS — BP 156/87 | HR 53 | Ht 67.0 in | Wt 170.0 lb

## 2024-03-29 DIAGNOSIS — I10 Essential (primary) hypertension: Secondary | ICD-10-CM

## 2024-03-29 LAB — COMPREHENSIVE METABOLIC PANEL
ALT: 38 U/L (ref 0–42)
AST: 27 U/L (ref 0–46)
Albumin/Globulin Ratio: 1.7 (ref 1.00–2.70)
Albumin: 4.2 g/dL (ref 3.5–5.2)
Alk Phosphatase: 73 U/L (ref 40–130)
Anion Gap: 11 mmol/L (ref 2–17)
BUN: 18 mg/dL (ref 8–23)
CO2: 26 mmol/L (ref 22–29)
Calcium: 9.4 mg/dL (ref 8.5–10.7)
Chloride: 104 mmol/L (ref 98–107)
Creatinine: 1.2 mg/dL (ref 0.7–1.3)
Est, Glom Filt Rate: 66 mL/min/1.73mÂ² (ref >=60)
Globulin: 2.5 g/dL (ref 1.9–4.4)
Glucose: 96 mg/dL (ref 70–99)
Osmolaliy Calculated: 283 mosm/kg (ref 270–287)
Potassium: 4.2 mmol/L (ref 3.5–5.3)
Sodium: 141 mmol/L (ref 135–145)
Total Bilirubin: 0.4 mg/dL (ref 0.00–1.20)
Total Protein: 6.7 g/dL (ref 5.7–8.3)

## 2024-03-29 LAB — IRON AND TIBC
Iron % Saturation: 20 % (ref 20–40)
Iron: 55 ug/dL — ABNORMAL LOW (ref 59–158)
TIBC: 273 ug/dL (ref 250–450)
UIBC: 218 ug/dL (ref 112.0–347.0)

## 2024-03-29 LAB — AMB POC COMPLETE CBC, AUTOMATED
Hematocrit, POC: 36.4 %
Hemoglobin, POC: 11.6 g/dL
Platelet Count, POC: 208 10*3/uL
RBC, POC: 5.49 M/uL
WBC, POC: 5.2 10*3/uL

## 2024-03-29 LAB — AMB POC URINALYSIS DIP STICK AUTO W/O MICRO
Bilirubin, Urine, POC: NEGATIVE
Blood (UA POC): NEGATIVE
Ketones, Urine, POC: NEGATIVE
Leukocyte Esterase, Urine, POC: NEGATIVE
Nitrite, Urine, POC: NEGATIVE
Specific Gravity, Urine, POC: 1.025 (ref 1.001–1.035)
Urobilinogen, POC: 0.2 mg/dL (ref <1.1)
pH, Urine, POC: 5.5 (ref 4.6–8.0)

## 2024-03-29 LAB — LIPID PANEL
Chol/HDL Ratio: 2.3 (ref 0.0–4.4)
Cholesterol, Total: 108 mg/dL (ref 100–200)
HDL: 47 mg/dL (ref >=40)
LDL Cholesterol: 47 mg/dL (ref 0.0–100.0)
LDL/HDL Ratio: 1
Triglycerides: 70 mg/dL (ref 0–149)
VLDL: 14 mg/dL (ref 5.0–40.0)

## 2024-03-29 LAB — MAGNESIUM: Magnesium: 2.1 mg/dL (ref 1.6–2.6)

## 2024-03-29 LAB — PSA SCREENING: PSA, Screening: 0.538 ng/mL (ref 0.000–4.000)

## 2024-03-29 LAB — VITAMIN B12: Vitamin B-12: 569 pg/mL (ref 232–1245)

## 2024-03-29 LAB — FERRITIN: Ferritin: 112 ng/mL (ref 30.0–400.0)

## 2024-03-29 LAB — FOLATE: Folate: >20 ng/mL (ref 4.80–24.20)

## 2024-03-29 MED ORDER — AMLODIPINE BESYLATE 10 MG PO TABS
10 | ORAL_TABLET | Freq: Every day | ORAL | 3 refills | 90.00000 days | Status: DC
Start: 2024-03-29 — End: 2024-10-10

## 2024-03-29 MED ORDER — AZELASTINE HCL 0.1 % NA SOLN
0.1 | Freq: Two times a day (BID) | NASAL | 3 refills | 50.00000 days | Status: DC
Start: 2024-03-29 — End: 2024-10-10

## 2024-03-29 MED ORDER — EDARBI 80 MG PO TABS
80 | ORAL_TABLET | ORAL | 3 refills | 30.00000 days | Status: DC
Start: 2024-03-29 — End: 2024-10-10

## 2024-03-29 MED ORDER — FLUTICASONE PROPIONATE 50 MCG/ACT NA SUSP
50 | Freq: Two times a day (BID) | NASAL | 5 refills | 60.00000 days | Status: DC
Start: 2024-03-29 — End: 2024-10-10

## 2024-03-29 MED ORDER — ROSUVASTATIN CALCIUM 10 MG PO TABS
10 | ORAL_TABLET | ORAL | 3 refills | 90.00000 days | Status: DC
Start: 2024-03-29 — End: 2024-10-10

## 2024-03-29 MED ORDER — CHLORTHALIDONE 25 MG PO TABS
25 | ORAL_TABLET | Freq: Every day | ORAL | 3 refills | 90.00000 days | Status: DC
Start: 2024-03-29 — End: 2024-10-10

## 2024-03-29 MED ORDER — CEFDINIR 300 MG PO CAPS
300 | ORAL_CAPSULE | Freq: Two times a day (BID) | ORAL | 0 refills | 7.00000 days | Status: AC
Start: 2024-03-29 — End: 2024-04-05

## 2024-03-29 MED ORDER — TAMSULOSIN HCL 0.4 MG PO CAPS
0.4 | ORAL_CAPSULE | Freq: Every day | ORAL | 3 refills | 30.00000 days | Status: DC
Start: 2024-03-29 — End: 2024-10-10

## 2024-03-29 MED ORDER — IBUPROFEN 800 MG PO TABS
800 | ORAL_TABLET | Freq: Two times a day (BID) | ORAL | 3 refills | 10.00000 days | Status: DC | PRN
Start: 2024-03-29 — End: 2024-10-10

## 2024-03-29 MED ORDER — ALBUTEROL SULFATE HFA 108 (90 BASE) MCG/ACT IN AERS
108 | RESPIRATORY_TRACT | 2 refills | 25.00000 days | Status: DC | PRN
Start: 2024-03-29 — End: 2024-10-10

## 2024-03-29 MED ORDER — QUETIAPINE FUMARATE 25 MG PO TABS
25 | ORAL_TABLET | Freq: Two times a day (BID) | ORAL | 0 refills | 30.00000 days | Status: DC
Start: 2024-03-29 — End: 2024-10-10

## 2024-03-29 NOTE — Progress Notes (Signed)
 CHIEF COMPLAINT:  Chief Complaint   Patient presents with    Hypertension        HISTORY OF PRESENT ILLNESS:  Timothy Velazquez is a 68 y.o. male with a hx of hypertension, hyperlipidemia, fibromyalgia, and psoriasis. Pt presents today for BP check and medication refills.    Pt is taking edarbi  80mg , amlodipine  10mg , and chlor 25mg  0.5 tablet daily for hypertension. Pt states he has been taking medication every other day so he would not run out before appt. He does monitor BP at home on occasion.     Below is the assessment and plan developed based on review of pertinent history, physical exam, labs, studies, and medications.    IMPRESSION/PLAN:         1. Essential (primary) hypertension  -     POC Urinalysis, Auto W/O Scope (81003)  -     Lipid Panel; Future  -     Comprehensive Metabolic Panel; Future  -     Magnesium; Future  -     POC CBC w/Auto Diff (33295)  The following orders have not been finalized:  -     chlorthalidone  (HYGROTON ) 25 MG tablet  -     Azilsartan Medoxomil (EDARBI ) 80 MG TABS  -     amLODIPine  (NORVASC ) 10 MG tablet  2. Mixed hyperlipidemia  -     POC Urinalysis, Auto W/O Scope (81003)  -     Lipid Panel; Future  -     Comprehensive Metabolic Panel; Future  -     Magnesium; Future  -     POC CBC w/Auto Diff (18841)  3. Fibromyalgia  -     POC Urinalysis, Auto W/O Scope (81003)  4. History of anemia  -     POC Urinalysis, Auto W/O Scope (81003)  -     POC CBC w/Auto Diff (66063)  -     Vitamin B12; Future  -     Iron and TIBC; Future  -     Folate; Future  -     Ferritin; Future  5. Psoriasis  6. Screening PSA (prostate specific antigen)  -     PSA Screening; Future  7. Nocturia  The following orders have not been finalized:  -     tamsulosin  (FLOMAX ) 0.4 MG capsule  8. Sinus symptom  The following orders have not been finalized:  -     cefdinir  (OMNICEF ) 300 MG capsule  -     fluticasone  (FLONASE ) 50 MCG/ACT nasal spray  -     azelastine  (ASTELIN ) 0.1 % nasal spray  9. Primary insomnia  The  following orders have not been finalized:  -     QUEtiapine (SEROQUEL) 25 MG tablet      F/U 1 month    Vaccine Counseling/ Health Maintenance:  reviewed age appropriate health screenings and immunizations. Discussed importance, risks, and benefits of immunizations. Recommend/ordered deficient screenings and immunizations.     Immunization History   Administered Date(s) Administered    COVID-19, PFIZER PURPLE top, DILUTE for use, (age 47 y+), 85mcg/0.3mL 01/24/2020, 02/14/2020    TD 5LF, TENIVAC, (age 7y+), IM, 0.68mL 11/16/2011, 06/23/2022        PHQ:      03/29/2024     8:59 AM   PHQ-9    Little interest or pleasure in doing things 0   Feeling down, depressed, or hopeless 0   PHQ-2 Score 0   PHQ-9 Total Score 0  CURRENT MEDICATION LIST:  Controlled substances monitoring: no signs of potential drug abuse or diversion identified  Prior to Admission medications    Medication Sig Start Date End Date Taking? Authorizing Provider   ibuprofen  (ADVIL ;MOTRIN ) 800 MG tablet Take 1 tablet by mouth 2 times daily as needed for Pain (take medication with food or milk) 11/14/23 05/12/24 Yes Zaida Hertz, MD   Azilsartan Medoxomil (EDARBI ) 80 MG TABS 1 tablet Orally Once a day for 90 day(s) 09/15/23  Yes Wurthmann, Jacklyn P, PA   amLODIPine  (NORVASC ) 10 MG tablet Take 1 tablet by mouth daily 09/15/23  Yes Wurthmann, Jacklyn P, PA   chlorthalidone  (HYGROTON ) 25 MG tablet Take 0.5 tablets by mouth daily 1 tablet in the morning with food Orally Once a day for 90 days 09/15/23  Yes Wurthmann, Jacklyn P, PA   tamsulosin  (FLOMAX ) 0.4 MG capsule Take 1 capsule by mouth daily 09/15/23  Yes Wurthmann, Jacklyn P, PA   SKYRIZI PEN 150 MG/ML SOAJ  01/12/23  Yes [provider]   albuterol  sulfate HFA (VENTOLIN  HFA) 108 (90 Base) MCG/ACT inhaler Inhale 2 puffs into the lungs every 4 hours as needed for Wheezing 02/28/23  Yes Zaida Hertz, MD   rosuvastatin  (CRESTOR ) 10 MG tablet 1 tablet Orally Once a day for 90 days 02/28/23   Yes Zaida Hertz, MD   ipratropium (ATROVENT ) 0.06 % nasal spray 2 sprays by Each Nostril route 3 times daily as needed for Rhinitis 11/04/23 11/11/23  Craig-Earl, Katherine E, APRN - NP   fluticasone  (FLONASE ) 50 MCG/ACT nasal spray 2 sprays by Each Nostril route daily  Patient not taking: Reported on 03/29/2024 04/22/23   Zaida Hertz, MD   azelastine  (ASTELIN ) 0.1 % nasal spray 2 sprays by Nasal route 2 times daily 04/22/23 09/15/23  Leslye Puccini G, MD   fluticasone  (FLONASE ) 50 MCG/ACT nasal spray 2 sprays by Each Nostril route in the morning and 2 sprays in the evening.  Patient not taking: Reported on 03/29/2024 10/22/21   Arlie Lain, APRN - NP   adalimumab (HUMIRA) 40 MG/0.8ML injection 0.8 ml Subcutaneous q2weeks  Patient not taking: Reported on 03/29/2024    [facility], MD   sildenafil (REVATIO) 20 MG tablet 1-5 tablets Orally 1 hour prior to sexual activity  Patient not taking: Reported on 06/23/2022 06/23/20   [facility], [facility], MD        ALLERGIES:  Allergies   Allergen Reactions    Tetanus Toxoid Other (See Comments)        HISTORY:  Past Medical History:   Diagnosis Date    Allergic rhinitis     Asthma     Atrial fibrillation (HCC)     Fibromyalgia     Headache     Hyperlipidemia     Hypertension     New daily persistent headache 10/22/2021    Psoriasis       Past Surgical History:   Procedure Laterality Date    PILONIDAL CYST EXCISION        Social History     Socioeconomic History    Marital status: Married     Spouse name: Not on file    Number of children: Not on file    Years of education: Not on file    Highest education level: Not on file   Occupational History    Not on file   Tobacco Use    Smoking status: Never  Passive exposure: Never    Smokeless tobacco: Never   Vaping Use    Vaping status: Never Used   Substance and Sexual Activity    Alcohol use: Yes     Alcohol/week: 2.0 standard drinks of alcohol     Types: 2 Drinks containing 0.5 oz of  alcohol per week     Comment: Usually have a cocktail with dinner on a Friday or Saturday    Drug use: Never    Sexual activity: Not Currently     Partners: Female   Other Topics Concern    Not on file   Social History Narrative    Not on file     Social Drivers of Health     Financial Resource Strain: Not on file   Food Insecurity: Not on file   Transportation Needs: Not on file   Physical Activity: Not on file   Stress: Not on file   Social Connections: Not on file   Intimate Partner Violence: Not on file   Housing Stability: Not on file      Family History   Problem Relation Age of Onset    High Blood Pressure Mother     Asthma Mother     Alzheimer's Disease Father     Dementia Father     Alcohol Abuse Brother         REVIEW OF SYSTEMS:  Pertinent items are noted in HPI.  PHYSICAL EXAM:  Vital Signs -   Vitals:    03/29/24 0844   BP: (!) 156/87   Pulse: 53   SpO2: 95%   Weight: 77.1 kg (170 lb)   Height: 1.702 m (5\' 7" )     Body mass index is 26.63 kg/m.   General Appearance: Well developed, well nourished in no acute distress   Eyes:  No gross abnormalities.  Ears:  Canals and TMs NI  Nose/Sinuses: Nasal congestion  Mouth/Throat:  Mucosa moist.  No lesions.  Pharynx without erythema, edema or exudate.  Neck: Supple, no mass, non-tender  Heart:  Heart sounds are normal.  Regular rate and rhythm without murmur, gallop or rub.  Lungs: Clear to auscultation bilaterally without rales, rhonchi or wheeze   Neurologic: Alert and oriented. No focal deficit  Psychiatric: Mood, affect, and thought content normal      LABS  Results for orders placed or performed in visit on 03/29/24   POC Urinalysis, Auto W/O Scope (81003)   Result Value Ref Range    Color (UA POC)      Clarity (UA POC)      Glucose, Urine, POC      Bilirubin, Urine, POC Negative     Ketones, Urine, POC Negative     Specific Gravity, Urine, POC 1.025 1.001 - 1.035    Blood (UA POC) Negative     pH, Urine, POC 5.5 4.6 - 8.0    Protein, Urine, POC Trace      Urobilinogen, POC 0.2 mg/dL <3.0 mg/dL    Nitrite, Urine, POC Negative Negative    Leukocyte Esterase, Urine, POC Negative    POC CBC w/Auto Diff (86578)   Result Value Ref Range    Hematocrit, POC 36.4 %    Hemoglobin, POC 11.6 G/DL    WBC, POC 4.69 K/uL    RBC, POC 5.49 M/uL    MCV      MCHC      MCH      Platelet Count, POC 208 K/UL    Neutrophil %  Lymphocyte %      Monocyte %      Neutrophil Absolute, POC      Lymphocye Absolute, POC      Monocyte Absolute, POC      Other WBC, POC           Follow up and Dispositions:  Orders Placed This Encounter    Lipid Panel     Standing Status:   Future     Number of Occurrences:   1     Expected Date:   03/29/2024     Expiration Date:   03/29/2025    Comprehensive Metabolic Panel     Standing Status:   Future     Number of Occurrences:   1     Expected Date:   03/29/2024     Expiration Date:   03/29/2025    Magnesium     Standing Status:   Future     Number of Occurrences:   1     Expected Date:   03/29/2024     Expiration Date:   03/29/2025    PSA Screening     Standing Status:   Future     Number of Occurrences:   1     Expected Date:   03/29/2024     Expiration Date:   03/29/2025    Vitamin B12     Standing Status:   Future     Number of Occurrences:   1     Expected Date:   03/29/2024     Expiration Date:   03/29/2025    Iron and TIBC     Standing Status:   Future     Number of Occurrences:   1     Expected Date:   03/29/2024     Expiration Date:   03/29/2025    Folate     Standing Status:   Future     Number of Occurrences:   1     Expected Date:   03/29/2024     Expiration Date:   03/29/2025    Ferritin     Standing Status:   Future     Number of Occurrences:   1     Expected Date:   03/29/2024     Expiration Date:   03/29/2025    POC Urinalysis, Auto W/O Scope (81003)    POC CBC w/Auto Diff (91478)      No follow-ups on file.       Seldon Dago, MA @DATE @         Please note that portions of this note were completed with computer software and voice recognition program.  Although all efforts were made to edit the dictations for accuracy, there may be errors in transcription that are not intended.

## 2024-04-02 ENCOUNTER — Encounter: Payer: PRIVATE HEALTH INSURANCE | Attending: Family Medicine | Primary: Family Medicine

## 2024-04-20 ENCOUNTER — Encounter

## 2024-04-23 ENCOUNTER — Encounter: Attending: Family Medicine | Primary: Family Medicine

## 2024-06-18 ENCOUNTER — Encounter: Payer: PRIVATE HEALTH INSURANCE | Attending: Physician Assistant | Primary: Family Medicine

## 2024-07-16 ENCOUNTER — Encounter: Payer: PRIVATE HEALTH INSURANCE | Attending: Physician Assistant | Primary: Family Medicine

## 2024-07-30 ENCOUNTER — Encounter: Payer: PRIVATE HEALTH INSURANCE | Attending: Physician Assistant | Primary: Family Medicine

## 2024-09-10 ENCOUNTER — Encounter: Payer: PRIVATE HEALTH INSURANCE | Attending: Physician Assistant | Primary: Family Medicine

## 2024-09-21 ENCOUNTER — Encounter

## 2024-10-10 ENCOUNTER — Ambulatory Visit
Admit: 2024-10-10 | Discharge: 2024-10-10 | Payer: PRIVATE HEALTH INSURANCE | Attending: Physician Assistant | Primary: Family Medicine

## 2024-10-10 VITALS — BP 135/82 | HR 57 | Temp 98.00000°F | Ht 67.0 in | Wt 174.0 lb

## 2024-10-10 DIAGNOSIS — I10 Essential (primary) hypertension: Principal | ICD-10-CM

## 2024-10-10 MED ORDER — FLUTICASONE PROPIONATE 50 MCG/ACT NA SUSP
50 | Freq: Two times a day (BID) | NASAL | 5 refills | Status: AC
Start: 2024-10-10 — End: ?

## 2024-10-10 MED ORDER — AMLODIPINE BESYLATE 10 MG PO TABS
10 | ORAL_TABLET | Freq: Every day | ORAL | 3 refills | Status: AC
Start: 2024-10-10 — End: ?

## 2024-10-10 MED ORDER — ROSUVASTATIN CALCIUM 10 MG PO TABS
10 | ORAL_TABLET | ORAL | 3 refills | Status: AC
Start: 2024-10-10 — End: ?

## 2024-10-10 MED ORDER — TAMSULOSIN HCL 0.4 MG PO CAPS
0.4 | ORAL_CAPSULE | Freq: Every day | ORAL | 3 refills | Status: AC
Start: 2024-10-10 — End: ?

## 2024-10-10 MED ORDER — ALBUTEROL SULFATE HFA 108 (90 BASE) MCG/ACT IN AERS
108 | RESPIRATORY_TRACT | 2 refills | Status: AC | PRN
Start: 2024-10-10 — End: ?

## 2024-10-10 MED ORDER — EDARBI 80 MG PO TABS
80 | ORAL_TABLET | ORAL | 3 refills | Status: AC
Start: 2024-10-10 — End: ?

## 2024-10-10 MED ORDER — AZELASTINE HCL 0.1 % NA SOLN
0.1 | Freq: Two times a day (BID) | NASAL | 3 refills | Status: AC
Start: 2024-10-10 — End: 2025-01-08

## 2024-10-10 MED ORDER — CHLORTHALIDONE 25 MG PO TABS
25 | ORAL_TABLET | Freq: Every day | ORAL | 3 refills | Status: AC
Start: 2024-10-10 — End: ?

## 2024-10-10 MED ORDER — IPRATROPIUM BROMIDE 0.06 % NA SOLN
0.06 | Freq: Three times a day (TID) | NASAL | 0 refills | Status: AC | PRN
Start: 2024-10-10 — End: 2024-10-17

## 2024-10-10 MED ORDER — IBUPROFEN 800 MG PO TABS
800 | ORAL_TABLET | Freq: Two times a day (BID) | ORAL | 3 refills | Status: AC | PRN
Start: 2024-10-10 — End: 2025-10-05

## 2024-10-10 MED ORDER — QUETIAPINE FUMARATE 25 MG PO TABS
25 | ORAL_TABLET | Freq: Two times a day (BID) | ORAL | 1 refills | Status: AC
Start: 2024-10-10 — End: ?

## 2024-10-10 NOTE — Assessment & Plan Note (Addendum)
"   Chronic, at goal (stable), continue current treatment plan    Orders:    ibuprofen  (ADVIL ;MOTRIN ) 800 MG tablet; Take 1 tablet by mouth 2 times daily as needed for Pain (take medication with food or milk)    "

## 2024-10-10 NOTE — Assessment & Plan Note (Signed)
"   Monitored by specialist- no acute findings meriting change in the plan         "

## 2024-10-10 NOTE — Assessment & Plan Note (Addendum)
"   Chronic, at goal (stable), continue current treatment plan    Orders:    rosuvastatin  (CRESTOR ) 10 MG tablet; 1 tablet Orally Once a day for 90 days    Lipid Panel; Future    Comprehensive Metabolic Panel; Future    Magnesium; Future    "

## 2024-10-10 NOTE — Assessment & Plan Note (Addendum)
"   Chronic, at goal (stable), continue current treatment plan    Orders:    ipratropium (ATROVENT ) 0.06 % nasal spray; 2 sprays by Each Nostril route 3 times daily as needed for Rhinitis    fluticasone  (FLONASE ) 50 MCG/ACT nasal spray; 2 sprays by Each Nostril route in the morning and 2 sprays in the evening.    azelastine  (ASTELIN ) 0.1 % nasal spray; 2 sprays by Nasal route 2 times daily    "

## 2024-10-10 NOTE — Assessment & Plan Note (Addendum)
"   Chronic, at goal (stable), continue current treatment plan    Orders:    chlorthalidone  (HYGROTON ) 25 MG tablet; Take 0.5 tablets by mouth daily 1 tablet in the morning with food Orally Once a day for 90 days    Azilsartan Medoxomil (EDARBI ) 80 MG TABS; 1 tablet Orally Once a day for 90 day(s)    amLODIPine  (NORVASC ) 10 MG tablet; Take 1 tablet by mouth daily    Comprehensive Metabolic Panel; Future    Magnesium; Future    Cbc With Auto Differential; Future    "

## 2024-10-10 NOTE — Progress Notes (Signed)
 "CHIEF COMPLAINT:  Chief Complaint   Patient presents with    Follow-up        HISTORY OF PRESENT ILLNESS:  Timothy Velazquez is a 68 y.o. male with history of hypertension, hyperlipidemia, BPH, chronic sinusitis, fibromyalgia, psoriasis, anemia, and insomnia presents today for follow-up medication refills.  He is overall feeling well today.  He does report a mild cold.  Denies any chest pain or shortness of breath.  He continues to follow with dermatology for [facility] for his psoriasis.    Below is the assessment and plan developed based on review of pertinent history, physical exam, labs, studies, and medications.    IMPRESSION/PLAN:      Assessment & Plan  Essential (primary) hypertension   Chronic, at goal (stable), continue current treatment plan    Orders:    chlorthalidone  (HYGROTON ) 25 MG tablet; Take 0.5 tablets by mouth daily 1 tablet in the morning with food Orally Once a day for 90 days    Azilsartan Medoxomil (EDARBI ) 80 MG TABS; 1 tablet Orally Once a day for 90 day(s)    amLODIPine  (NORVASC ) 10 MG tablet; Take 1 tablet by mouth daily    Comprehensive Metabolic Panel; Future    Magnesium; Future    Cbc With Auto Differential; Future    Mixed hyperlipidemia   Chronic, at goal (stable), continue current treatment plan    Orders:    rosuvastatin  (CRESTOR ) 10 MG tablet; 1 tablet Orally Once a day for 90 days    Lipid Panel; Future    Comprehensive Metabolic Panel; Future    Magnesium; Future    Fibromyalgia   Chronic, at goal (stable), continue current treatment plan    Orders:    ibuprofen  (ADVIL ;MOTRIN ) 800 MG tablet; Take 1 tablet by mouth 2 times daily as needed for Pain (take medication with food or milk)    Nocturia   Chronic, at goal (stable), continue current treatment plan    Orders:    tamsulosin  (FLOMAX ) 0.4 MG capsule; Take 1 capsule by mouth daily    Chronic sinusitis, unspecified location   Chronic, at goal (stable), continue current treatment plan    Orders:    ipratropium (ATROVENT ) 0.06 %  nasal spray; 2 sprays by Each Nostril route 3 times daily as needed for Rhinitis    fluticasone  (FLONASE ) 50 MCG/ACT nasal spray; 2 sprays by Each Nostril route in the morning and 2 sprays in the evening.    azelastine  (ASTELIN ) 0.1 % nasal spray; 2 sprays by Nasal route 2 times daily    Primary insomnia   Chronic, at goal (stable), continue current treatment plan    Orders:    QUEtiapine  (SEROQUEL ) 25 MG tablet; Take 1 tablet by mouth 2 times daily Take 1/2, 1, 2, or 3 tablets prn sleep    Bronchospasm   Chronic, at goal (stable), continue current treatment plan    Orders:    albuterol  sulfate HFA (VENTOLIN  HFA) 108 (90 Base) MCG/ACT inhaler; Inhale 2 puffs into the lungs every 4 hours as needed for Wheezing    Anemia, unspecified type    chronic will get updated lab.  Negative Hemoccult cards last year    Orders:    Vitamin B12; Future    Iron and TIBC; Future    Folate; Future    Ferritin; Future    Cbc With Auto Differential; Future    Screening for colorectal cancer    will proceed with Cologuard.  He reports having a normal colonoscopy years ago  but had a bad experience where he woke up in the middle of it.  He has had negative Hemoccult cards.  No family history colon cancer    Orders:    Cologuard (Fecal DNA Colorectal Cancer Screening)    Screening PSA (prostate specific antigen)       Orders:    PSA Screening; Future    Screening for diabetes mellitus       Orders:    Hemoglobin A1C; Future    Psoriasis   Monitored by specialist- no acute findings meriting change in the plan             Vaccine Counseling/ Health Maintenance:  reviewed age appropriate health screenings and immunizations. Discussed importance, risks, and benefits of immunizations. Recommend/ordered deficient screenings and immunizations.       CURRENT MEDICATION LIST:  Prior to Admission medications   Medication Sig Start Date End Date Taking? Authorizing Provider   tamsulosin  (FLOMAX ) 0.4 MG capsule Take 1 capsule by mouth daily 10/10/24   Yes Roczen Waymire P, PA   rosuvastatin  (CRESTOR ) 10 MG tablet 1 tablet Orally Once a day for 90 days 10/10/24  Yes Aundraya Dripps P, PA   QUEtiapine  (SEROQUEL ) 25 MG tablet Take 1 tablet by mouth 2 times daily Take 1/2, 1, 2, or 3 tablets prn sleep 10/10/24  Yes Barri Neidlinger P, PA   ipratropium (ATROVENT ) 0.06 % nasal spray 2 sprays by Each Nostril route 3 times daily as needed for Rhinitis 10/10/24 10/17/24 Yes Jeanett Antonopoulos P, PA   ibuprofen  (ADVIL ;MOTRIN ) 800 MG tablet Take 1 tablet by mouth 2 times daily as needed for Pain (take medication with food or milk) 10/10/24 10/05/25 Yes Dorothy Polhemus P, PA   fluticasone  (FLONASE ) 50 MCG/ACT nasal spray 2 sprays by Each Nostril route in the morning and 2 sprays in the evening. 10/10/24  Yes Ishmail Mcmanamon P, PA   chlorthalidone  (HYGROTON ) 25 MG tablet Take 0.5 tablets by mouth daily 1 tablet in the morning with food Orally Once a day for 90 days 10/10/24  Yes Emina Ribaudo P, PA   Azilsartan Medoxomil (EDARBI ) 80 MG TABS 1 tablet Orally Once a day for 90 day(s) 10/10/24  Yes Jaekwon Mcclune P, PA   azelastine  (ASTELIN ) 0.1 % nasal spray 2 sprays by Nasal route 2 times daily 10/10/24 01/08/25 Yes Natasha Paulson P, PA   amLODIPine  (NORVASC ) 10 MG tablet Take 1 tablet by mouth daily 10/10/24  Yes Karris Deangelo P, PA   albuterol  sulfate HFA (VENTOLIN  HFA) 108 (90 Base) MCG/ACT inhaler Inhale 2 puffs into the lungs every 4 hours as needed for Wheezing 10/10/24  Yes Darilyn Amyre Segundo P, [place]   SKYRIZI PEN 150 MG/ML SOAJ  01/12/23  Yes [provider]   fluticasone  (FLONASE ) 50 MCG/ACT nasal spray 2 sprays by Each Nostril route daily  Patient not taking: Reported on 10/10/2024 04/22/23   Netty Zachary MATSU, MD   adalimumab (HUMIRA) 40 MG/0.8ML injection 0.8 ml Subcutaneous q2weeks  Patient not taking: Reported on 03/29/2024    [facility]con[facility], MD   sildenafil (REVATIO) 20 MG tablet 1-5 tablets Orally 1 hour  prior to sexual activity  Patient not taking: Reported on 06/23/2022 06/23/20   [facility]conciliation, [facility], MD        ALLERGIES:  No Known Allergies       HISTORY:  Past Medical History:   Diagnosis Date    Allergic rhinitis     Asthma     Atrial  fibrillation (HCC)     Fibromyalgia     Headache     Hyperlipidemia     Hypertension     New daily persistent headache 10/22/2021    Psoriasis       Past Surgical History:   Procedure Laterality Date    PILONIDAL CYST EXCISION        Social History     Socioeconomic History    Marital status: Married     Spouse name: Not on file    Number of children: Not on file    Years of education: Not on file    Highest education level: Not on file   Occupational History    Not on file   Tobacco Use    Smoking status: Never     Passive exposure: Never    Smokeless tobacco: Never   Vaping Use    Vaping status: Never Used   Substance and Sexual Activity    Alcohol use: Yes     Alcohol/week: 2.0 standard drinks of alcohol     Types: 2 Drinks containing 0.5 oz of alcohol per week     Comment: Usually have a cocktail with dinner on a Friday or Saturday    Drug use: Never    Sexual activity: Not Currently     Partners: Female   Other Topics Concern    Not on file   Social History Narrative    Not on file     Social Drivers of Health     Financial Resource Strain: Not on file   Food Insecurity: Not on file   Transportation Needs: Not on file   Physical Activity: Not on file   Stress: Not on file   Social Connections: Not on file   Intimate Partner Violence: Not on file   Housing Stability: Not on file      Family History   Problem Relation Age of Onset    High Blood Pressure Mother     Asthma Mother     Alzheimer's Disease Father     Dementia Father     Alcohol Abuse Brother         REVIEW OF SYSTEMS:  Pertinent items are noted in HPI.    PHYSICAL EXAM:  Vital Signs -   Vitals:    10/10/24 1009   BP: 135/82   Pulse: 57   Temp: 98 F (36.7 C)   Weight: 78.9 kg (174 lb)   Height: 1.702  m (5' 7)     Body mass index is 27.25 kg/m.     Physical Exam  Vitals reviewed.   Constitutional:       General: He is not in acute distress.     Appearance: Normal appearance. He is well-developed.   HENT:      Head: Normocephalic and atraumatic.   Eyes:      Extraocular Movements: Extraocular movements intact.      Conjunctiva/sclera: Conjunctivae normal.      Pupils: Pupils are equal, round, and reactive to light.   Cardiovascular:      Rate and Rhythm: Normal rate and regular rhythm.      Heart sounds: No murmur heard.  Pulmonary:      Effort: Pulmonary effort is normal.      Breath sounds: Normal breath sounds.   Musculoskeletal:      Cervical back: Neck supple.   Skin:     General: Skin is warm and dry.   Neurological:  General: No focal deficit present.      Mental Status: He is alert and oriented to person, place, and time.   Psychiatric:         Behavior: Behavior normal.         Thought Content: Thought content normal.         LABS  No results found for this visit on 10/10/24.      Follow up and Dispositions:  Orders Placed This Encounter    Cologuard (Fecal DNA Colorectal Cancer Screening)    Lipid Panel     Standing Status:   Future     Expected Date:   10/10/2024     Expiration Date:   10/10/2025    Comprehensive Metabolic Panel     Standing Status:   Future     Expected Date:   10/10/2024     Expiration Date:   10/10/2025    Magnesium     Standing Status:   Future     Expected Date:   10/10/2024     Expiration Date:   10/10/2025    PSA Screening     Standing Status:   Future     Expected Date:   10/10/2024     Expiration Date:   10/10/2025    Vitamin B12     Standing Status:   Future     Expected Date:   10/10/2024     Expiration Date:   10/10/2025    Iron and TIBC     Standing Status:   Future     Expected Date:   10/10/2024     Expiration Date:   10/10/2025    Folate     Standing Status:   Future     Expected Date:   10/10/2024     Expiration Date:   10/10/2025    Ferritin     Standing Status:    Future     Expected Date:   10/10/2024     Expiration Date:   10/10/2025    Cbc With Auto Differential     Standing Status:   Future     Expected Date:   10/10/2024     Expiration Date:   10/10/2025    Hemoglobin A1C     Standing Status:   Future     Expected Date:   10/10/2024     Expiration Date:   10/10/2025    tamsulosin  (FLOMAX ) 0.4 MG capsule     Sig: Take 1 capsule by mouth daily     Dispense:  90 capsule     Refill:  3    rosuvastatin  (CRESTOR ) 10 MG tablet     Sig: 1 tablet Orally Once a day for 90 days     Dispense:  90 tablet     Refill:  3    QUEtiapine  (SEROQUEL ) 25 MG tablet     Sig: Take 1 tablet by mouth 2 times daily Take 1/2, 1, 2, or 3 tablets prn sleep     Dispense:  90 tablet     Refill:  1    ipratropium (ATROVENT ) 0.06 % nasal spray     Sig: 2 sprays by Each Nostril route 3 times daily as needed for Rhinitis     Dispense:  1 each     Refill:  0    ibuprofen  (ADVIL ;MOTRIN ) 800 MG tablet     Sig: Take 1 tablet by mouth 2 times daily as needed for Pain (take medication with food or  milk)     Dispense:  180 tablet     Refill:  3    fluticasone  (FLONASE ) 50 MCG/ACT nasal spray     Sig: 2 sprays by Each Nostril route in the morning and 2 sprays in the evening.     Dispense:  16 g     Refill:  5    chlorthalidone  (HYGROTON ) 25 MG tablet     Sig: Take 0.5 tablets by mouth daily 1 tablet in the morning with food Orally Once a day for 90 days     Dispense:  45 tablet     Refill:  3    Azilsartan Medoxomil (EDARBI ) 80 MG TABS     Sig: 1 tablet Orally Once a day for 90 day(s)     Dispense:  90 tablet     Refill:  3    azelastine  (ASTELIN ) 0.1 % nasal spray     Sig: 2 sprays by Nasal route 2 times daily     Dispense:  3 each     Refill:  3    amLODIPine  (NORVASC ) 10 MG tablet     Sig: Take 1 tablet by mouth daily     Dispense:  90 tablet     Refill:  3    albuterol  sulfate HFA (VENTOLIN  HFA) 108 (90 Base) MCG/ACT inhaler     Sig: Inhale 2 puffs into the lungs every 4 hours as needed for Wheezing      Dispense:  1 each     Refill:  2      Return in about 6 months (around 04/10/2025).       Jesus SHAUNNA Bart, PA    Please note that portions of this note were completed with computer software and voice recognition program. Although all efforts were made to edit the dictations for accuracy, there may be errors in transcription that are not intended.     "

## 2024-10-10 NOTE — Assessment & Plan Note (Addendum)
"   Chronic, at goal (stable), continue current treatment plan    Orders:    tamsulosin  (FLOMAX ) 0.4 MG capsule; Take 1 capsule by mouth daily    "

## 2024-10-10 NOTE — Assessment & Plan Note (Addendum)
" {  A/P Summary:[phone]} chronic will get updated lab.  Negative Hemoccult cards last year    Orders:    Vitamin B12; Future    Iron and TIBC; Future    Folate; Future    Ferritin; Future    Cbc With Auto Differential; Future    "

## 2024-10-15 ENCOUNTER — Encounter: Payer: PRIVATE HEALTH INSURANCE | Attending: Physician Assistant | Primary: Family Medicine

## 2024-10-22 ENCOUNTER — Ambulatory Visit
Admit: 2024-10-22 | Discharge: 2024-10-22 | Payer: PRIVATE HEALTH INSURANCE | Attending: Family | Primary: Family Medicine

## 2024-10-22 VITALS — BP 136/70 | HR 73 | Temp 97.90000°F | Resp 16 | Ht 67.0 in | Wt 176.0 lb

## 2024-10-22 DIAGNOSIS — R051 Acute cough: Principal | ICD-10-CM

## 2024-10-22 LAB — AMB POC COVID-19 COV: SARS-COV-2 RNA, POC: NEGATIVE

## 2024-10-22 LAB — POCT RESPIRATORY SYNCYTIAL VIRUS: RSV Rapid Ag: NEGATIVE

## 2024-10-22 NOTE — Progress Notes (Signed)
 "  Timothy Velazquez (DOB:  20-Feb-1956) is a 68 y.o. male here for evaluation of the following chief complaint(s):  Other (Grandchildren tested positive for RSV. Symptoms began 3 days ago ), Congestion, Cough, and Pharyngitis      Subjective   SUBJECTIVE/OBJECTIVE:  HPI  Here with c/o nasal congestion, cough, and sore throat x3 days.  Reports exposure to RSV.  Denies fevers, chills, nvd, cp, sob.  Has tried ibuprofen  with partial relief of symptoms.      Review of Systems  See HPI, all other systems reviewed and are negative        Objective   Physical Exam  Vitals and nursing note reviewed.   Constitutional:       Appearance: Normal appearance.   HENT:      Head: Normocephalic and atraumatic.      Right Ear: Tympanic membrane, ear canal and external ear normal.      Left Ear: Tympanic membrane, ear canal and external ear normal.      Nose: Congestion and rhinorrhea present.      Mouth/Throat:      Mouth: Mucous membranes are moist.      Pharynx: Oropharynx is clear. Posterior oropharyngeal erythema present. No oropharyngeal exudate.   Eyes:      Conjunctiva/sclera: Conjunctivae normal.      Pupils: Pupils are equal, round, and reactive to light.   Cardiovascular:      Rate and Rhythm: Normal rate and regular rhythm.      Heart sounds: Normal heart sounds.   Pulmonary:      Effort: Pulmonary effort is normal.      Breath sounds: Normal breath sounds.   Skin:     General: Skin is warm and dry.   Neurological:      General: No focal deficit present.      Mental Status: He is alert and oriented to person, place, and time.   Psychiatric:         Mood and Affect: Mood normal.         Behavior: Behavior normal.         Thought Content: Thought content normal.         Judgment: Judgment normal.       BP 136/70   Pulse 73   Temp 97.9 F (36.6 C) (Tympanic)   Resp 16   Ht 1.702 m (5' 7)   Wt 79.8 kg (176 lb)   SpO2 95%   BMI 27.57 kg/m          Assessment & Plan   ASSESSMENT/PLAN:  1. Acute cough  -     AMB POC  COVID-19 COV  -     POCT Respiratory Syncytial Virus      Results for orders placed or performed in visit on 10/22/24   AMB POC COVID-19 COV   Result Value Ref Range    SARS-COV-2 RNA, POC Negative     Lot number swab      EXP date swab      Lot number solution      EXP date solution      LOT NUMBER POC      EXPIRATION DATE     POCT Respiratory Syncytial Virus   Result Value Ref Range    RSV Rapid Ag negative       Negative covid and RSV today, etiology likely other viral illness, recommend otc cough/cold medications for symptomatic support, tylenol/ibuprofen  for fever/pain, push fluids, rest.  F/u in our office or with pcp for new/worsening/persistent symptoms.      Return if symptoms worsen or fail to improve, for re-evaluation.             An electronic signature was used to authenticate this note.    --Alan LELON Dusky, APRN - NP   "
